# Patient Record
Sex: Male | Born: 1970 | Race: Black or African American | Hispanic: No | Marital: Single | Smoking: Current every day smoker
Health system: Southern US, Community
[De-identification: ages and names within clinical notes are randomized; demographics above are authoritative.]

## PROBLEM LIST (undated history)

## (undated) DIAGNOSIS — I10 Essential (primary) hypertension: Secondary | ICD-10-CM

## (undated) HISTORY — PX: NO PAST SURGERIES: SHX2092

---

## 2008-10-23 ENCOUNTER — Emergency Department: Payer: Self-pay | Admitting: Unknown Physician Specialty

## 2009-08-20 ENCOUNTER — Emergency Department: Payer: Self-pay | Admitting: Emergency Medicine

## 2009-08-23 ENCOUNTER — Emergency Department: Payer: Self-pay | Admitting: Emergency Medicine

## 2009-10-27 ENCOUNTER — Emergency Department: Payer: Self-pay | Admitting: Emergency Medicine

## 2009-11-04 ENCOUNTER — Emergency Department: Payer: Self-pay | Admitting: Emergency Medicine

## 2014-12-26 ENCOUNTER — Encounter (HOSPITAL_COMMUNITY): Payer: Self-pay | Admitting: Emergency Medicine

## 2014-12-26 ENCOUNTER — Emergency Department (HOSPITAL_COMMUNITY)
Admission: EM | Admit: 2014-12-26 | Discharge: 2014-12-26 | Disposition: A | Discharge status: Home / Self Care | Payer: Self-pay | Attending: Emergency Medicine | Admitting: Emergency Medicine

## 2014-12-26 DIAGNOSIS — Z720 Tobacco use: Secondary | ICD-10-CM | POA: Insufficient documentation

## 2014-12-26 DIAGNOSIS — X150XXA Contact with hot stove (kitchen), initial encounter: Secondary | ICD-10-CM | POA: Insufficient documentation

## 2014-12-26 DIAGNOSIS — S61215A Laceration without foreign body of left ring finger without damage to nail, initial encounter: Secondary | ICD-10-CM | POA: Insufficient documentation

## 2014-12-26 DIAGNOSIS — Y9389 Activity, other specified: Secondary | ICD-10-CM | POA: Insufficient documentation

## 2014-12-26 DIAGNOSIS — Y998 Other external cause status: Secondary | ICD-10-CM | POA: Insufficient documentation

## 2014-12-26 DIAGNOSIS — S61219A Laceration without foreign body of unspecified finger without damage to nail, initial encounter: Secondary | ICD-10-CM

## 2014-12-26 DIAGNOSIS — Y9289 Other specified places as the place of occurrence of the external cause: Secondary | ICD-10-CM | POA: Insufficient documentation

## 2014-12-26 MED ORDER — LIDOCAINE HCL (PF) 1 % IJ SOLN
5.0000 mL | Freq: Once | INTRAMUSCULAR | Status: AC
  Administered 2014-12-26: 5 mL
  Filled 2014-12-26: qty 5

## 2014-12-26 MED ORDER — NAPROXEN 250 MG PO TABS: 250.0000 mg | ORAL_TABLET | Freq: Two times a day (BID) | ORAL | Status: DC

## 2014-12-26 MED ORDER — ACETAMINOPHEN 325 MG PO TABS
650.0000 mg | ORAL_TABLET | Freq: Once | ORAL | Status: AC
  Administered 2014-12-26: 650 mg via ORAL
  Filled 2014-12-26: qty 2
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
## 2014-12-26 NOTE — Discharge Instructions (Signed)
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
## 2015-01-02 ENCOUNTER — Encounter (HOSPITAL_COMMUNITY): Payer: Self-pay | Admitting: *Deleted

## 2015-01-02 ENCOUNTER — Emergency Department (HOSPITAL_COMMUNITY)
Admission: EM | Admit: 2015-01-02 | Discharge: 2015-01-02 | Disposition: A | Discharge status: Home / Self Care | Payer: Self-pay | Attending: Emergency Medicine | Admitting: Emergency Medicine

## 2015-01-02 DIAGNOSIS — Z791 Long term (current) use of non-steroidal anti-inflammatories (NSAID): Secondary | ICD-10-CM | POA: Insufficient documentation

## 2015-01-02 DIAGNOSIS — Z720 Tobacco use: Secondary | ICD-10-CM | POA: Insufficient documentation

## 2015-01-02 DIAGNOSIS — Z4802 Encounter for removal of sutures: Secondary | ICD-10-CM | POA: Insufficient documentation
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
## 2015-01-02 NOTE — ED Notes (Signed)
Pt returns to day to have sutures removed from Lt ring finger.

## 2015-01-02 NOTE — Discharge Instructions (Signed)
Suture Removal, Care After Refer to this sheet in the next few weeks. These instructions provide you with information on caring for yourself after your procedure. Your health care provider may also give you more specific instructions. Your treatment has been planned according to current medical practices, but problems sometimes occur. Call your health care provider if you have any problems or questions after your procedure. WHAT TO EXPECT AFTER THE PROCEDURE After your stitches (sutures) are removed, it is typical to have the following:  Some discomfort and swelling in the wound area.  Slight redness in the area. HOME CARE INSTRUCTIONS   If you have skin adhesive strips over the wound area, do not take the strips off. They will fall off on their own in a few days. If the strips remain in place after 14 days, you may remove them.  Change any bandages (dressings) at least once a day or as directed by your health care provider. If the bandage sticks, soak it off with warm, soapy water.  Apply cream or ointment only as directed by your health care provider. If using cream or ointment, wash the area with soap and water 2 times a day to remove all the cream or ointment. Rinse off the soap and pat the area dry with a clean towel.  Keep the wound area dry and clean. If the bandage becomes wet or dirty, or if it develops a bad smell, change it as soon as possible.  Continue to protect the wound from injury.  Use sunscreen when out in the sun. New scars become sunburned easily. SEEK MEDICAL CARE IF:  You have increasing redness, swelling, or pain in the wound.  You see pus coming from the wound.  You have a fever.  You notice a bad smell coming from the wound or dressing.  Your wound breaks open (edges not staying together). Document Released: 02/28/2001 Document Revised: 03/26/2013 Document Reviewed: 01/15/2013 [facility] Patient Information 2015 [place]. This information is not  intended to replace advice given to you by your health care provider. Make sure you discuss any questions you have with your health care provider.   Emergency Department Resource Guide 1) Find a Doctor and Pay Out of Pocket Although you won't have to find out who is covered by your insurance plan, it is a good idea to ask around and get recommendations. You will then need to call the office and see if the doctor you have chosen will accept you as a new patient and what types of options they offer for patients who are self-pay. Some doctors offer discounts or will set up payment plans for their patients who do not have insurance, but you will need to ask so you aren't surprised when you get to your appointment.  2) Contact Your Local Health Department Not all health departments have doctors that can see patients for sick visits, but many do, so it is worth a call to see if yours does. If you don't know where your local health department is, you can check in your phone book. The CDC also has a tool to help you locate your state's health department, and many state websites also have listings of all of their local health departments.  3) Find a Walk-in Clinic If your illness is not likely to be very severe or complicated, you may want to try a walk in clinic. These are popping up all over the country in pharmacies, drugstores, and shopping centers. They're usually staffed by nurse  or physician assistants that have been trained to treat common illnesses and complaints. They're usually fairly quick and inexpensive. However, if you have serious medical issues or chronic medical problems, these are probably not your best option. ° °No Primary Care Doctor: °- Call Health Connect at  832-8000 - they can help you locate a primary care doctor that  accepts your insurance, provides certain services, etc. °- Physician Referral Service- 1-800-533-3463 ° °Chronic Pain Problems: °Organization          Address  Phone   Notes  °[facility]  ([phone]) [phone] Patients need to be referred by their primary care doctor.  ° °Medication Assistance: °Organization         Address  Phone   Notes  °[place] [facility] [address] °[place], [place] [address] (336) 641-8030 --Must be a resident of [place] °-- Must have NO insurance coverage whatsoever (no Medicaid/ Medicare, etc.) °-- The pt. MUST have a primary care doctor that directs their care regularly and follows them in the community °  °[facility]ist  (866) 331-1348   °[employer]  (888) 892-1162   ° °Agencies that provide inexpensive medical care: °Organization         Address  Phone   Notes  °[facility]  (336) 832-8035   °[facility]    (336) 832-7272   °[facility] [address] °[place], [place] [address] (336) 832-4777   °[facility] [place] [address], °[place] (336) 271-4999   °[facility]anned Parenthood    (336) 373-0678   °[facility]    (336) 272-1050   °[facility] ° [address], [place] Phone:  (336) 832-4444, Fax:  (336) 832-4440 Hours of Operation:  9 am - 6 pm, M-F.  Also accepts Medicaid/Medicare and self-pay.  °[facility] ° [address], [place] Phone: (336) 832-3150, Fax: (336) [phone]. Hours of Operation:  8:30 am - 5:30 pm, M-F.  Also accepts Medicaid and self-pay.  °[facility] [place] [address], [place] Phone: (336) 878-6027   °[facility] [address], [place], [place] (336)723-1848, Ext. 123 Mondays & Thursdays: 7-9 AM.  First 15 patients are seen on a first come, first serve basis. °  ° °Medicaid-accepting [place] Providers: ° °Organization         Address  Phone   Notes  °[facility] [address], [place] ([phone]) [phone] Also accepts self-pay patients.  °[facility] [address], [place] ° (336) 856-9996   °[facility] [address], [place] (336) 288-8857   °[facility] [address] [place] [address], [place] (336) 299-7000   °Veita Bland [address], [place]  ° (336) 373-1557 Only accepts [place] Access Medicaid patients after they have their name applied to their card.  ° °Self-Pay (no insurance) in [place]: ° °Organization         Address  Phone   Notes  °Sickle Cell Patients, [facility] [address], [place] (336) 832-1970   °[facility] [address], [place] (336) 832-4400   °[facility] ° [address], [place] (336) 992-4800   °[facility]/Dr. Osei-Bonsu ° [address] [place] or [address], [place] [phone] Phone number for both [place] and [place] locations is the same.  Urgent Medical and [facility] [address] [place] [phone]   [facility] [address], [place] or [address] [phone]   [facility] [address], [place] [phone], phone; [phone], fax Sees patients 1st and 3rd Saturday of every month.  Must not qualify for public or private insurance (i.e. Medicaid, Medicare, [place] Health Choice, Veterans' Benefits)  Household income should be no more than 200% of the poverty level The clinic cannot treat you if you are pregnant or think you are pregnant  Sexually transmitted diseases are not treated at the clinic.    Dental Care: Organization         Address  Phone  Notes  [facility] [address] [place], [place] (339) [phone] Accepts children up to age 78 who are enrolled in [place] or [place] Health Choice; pregnant women with a Medicaid card; and  children who have applied for Medicaid or [place] Health Choice, but were declined, whose parents can pay a reduced fee at time of service.  [facility] of [facility]  [address], [place] [phone] Accepts children up to age 27 who are enrolled in [place] or [place] Health Choice; pregnant women with a Medicaid card; and children who have applied for Medicaid or [place] Health Choice, but were declined, whose parents can pay a reduced fee at time of service.  [facility]  [address] [place], [place] [phone] Patients are seen by appointment only. Walk-ins are not accepted. [facility] will see patients 28 years of age and older. Monday - Tuesday (8am-5pm) Most Wednesdays (8:30-5pm) $30 per visit, cash only  [facility]  [address], [facility] [phone] Patients are seen by appointment only. Walk-ins are not accepted. [facility] will see patients 71 years of age and older. One Wednesday Evening (Monthly: Volunteer Based).  $30 per visit, cash only  [employer]  [phone] for adults; Children under age 58, call Graduate Pediatric Dentistry at [phone]. Children aged 52-14, please call [phone] to request a pediatric application.  Dental services are provided in all areas of dental care including fillings, crowns and bridges, complete and partial dentures, implants, gum treatment, root canals, and extractions. Preventive care is also provided. Treatment is provided to both adults and children. Patients are selected via a lottery and there is often a waiting list.   [facility] [address] [place]  [phone] [email or web address]   [facility] [address] [place], [place] [phone], Ext. 123 Second and Fourth Thursday of each month, opens at 6:30 AM; Clinic ends at 9 AM.  Patients are seen on a first-come first-served  basis, and a limited number are seen during each clinic.   [facility]  [address] [place], [place] [phone]   Eligibility Requirements You must have lived in [place], [place], or [place] counties for at least the last three months.   You cannot be eligible for state or federal sponsored [place], including [employer], [place], or [employer].   You generally cannot be eligible for healthcare insurance through your employer.  How to apply: Eligibility screenings are held every Tuesday and Wednesday afternoon from 1:00 pm until 4:00 pm. You do not need an appointment for the interview!  [facility] [address] [place], [place]   [place]  [place] Department  [place]  [phone]    Behavioral Health Resources in the Community: Intensive Outpatient Programs Organization         Address  Phone  Notes  [facility] [place]. [address], [place], [place] ([phone]) [phone]   [facility] [address] [place], [place]   [facility] [address], [place], [place]   [place] [address]  [place], [place] or [phone]   Substance Abuse Resources Organization         Address  Phone  Notes  [facility]  [phone]   [place]  [phone]   [facility]   Chinita Pester  254-668-0621   Residential & Outpatient Substance Abuse Program  450-514-8025   Psychological Services Organization         Address  Phone  Notes  [facility] [place]  [place]  [phone]   [place] [address], [place] or (705)[phone]    Mobile Crisis Teams Organization          Address  Phone  Notes  [facility], Mobile Crisis Care Unit  (364) 867-1037   [facility]  [address] [place], [place]   [place] [address] [place] [phone]    Self-Help/Support Groups Organization         Address  Phone             Notes  [place] of [place] - variety of support groups  [place] Call for more information  Narcotics Anonymous (NA), [facility] [address], [employer] [place]  2 meetings at this location   Special educational needs teacher         Address  Phone  Notes  [facility] [place],    [place]  [phone]   [facility]  [address] [place] [address], [place], [place]   [place], [place] [phone] Admissions: 8am-3pm M-F  Incentives Substance [place] [address]    [place], [place] [phone]   [facility] [address] [place], [place], [place]   [facility] [address],  [place], [place]   [facility] - Intensive Outpatient [address], [place], [place]   [facility] ([place]  [place], [place] 1-217-665-1281 or [phone]   [facility] ([facility]) [address], [place], [place] Accepts Medicaid  [facility] [place] [address],  [place] [phone]-(906) [phone] Substance Abuse/Addiction Treatment   [facility] Organization         Address  Phone  Notes  [facility]  [phone]   Domenic Schwab, PhD [address] [place], [place]   (704)[phone] or [phone]   [place], [place] [phone]   [facility] [address],  [place], [place] [phone] Insurance/Medicaid/sponsorship  through [facility] and Families [address] [place], [place] [phone] [place] [address]   [place], [place] [phone]    Dr. Adele Schilder  [phone]   [facility] Dept. 1) [address], [place] 2) [place] 3)  [address], [name] [phone]  [phone]   [place] [phone] or [phone] (After Hours)

## 2015-01-02 NOTE — ED Notes (Signed)
Declined W/C at D/C and was escorted to lobby by RN. 

## 2017-02-19 ENCOUNTER — Encounter (HOSPITAL_COMMUNITY): Payer: Self-pay | Admitting: Emergency Medicine

## 2017-02-19 ENCOUNTER — Emergency Department (HOSPITAL_COMMUNITY)
Admission: EM | Admit: 2017-02-19 | Discharge: 2017-02-19 | Disposition: A | Discharge status: Home / Self Care | Payer: Self-pay | Attending: Emergency Medicine | Admitting: Emergency Medicine

## 2017-02-19 DIAGNOSIS — S51812A Laceration without foreign body of left forearm, initial encounter: Secondary | ICD-10-CM | POA: Insufficient documentation

## 2017-02-19 DIAGNOSIS — Y929 Unspecified place or not applicable: Secondary | ICD-10-CM | POA: Insufficient documentation

## 2017-02-19 DIAGNOSIS — Y999 Unspecified external cause status: Secondary | ICD-10-CM | POA: Insufficient documentation

## 2017-02-19 DIAGNOSIS — I10 Essential (primary) hypertension: Secondary | ICD-10-CM | POA: Insufficient documentation

## 2017-02-19 DIAGNOSIS — Y9389 Activity, other specified: Secondary | ICD-10-CM | POA: Insufficient documentation

## 2017-02-19 DIAGNOSIS — F17200 Nicotine dependence, unspecified, uncomplicated: Secondary | ICD-10-CM | POA: Insufficient documentation

## 2017-02-19 DIAGNOSIS — S41112A Laceration without foreign body of left upper arm, initial encounter: Secondary | ICD-10-CM | POA: Insufficient documentation

## 2017-02-19 DIAGNOSIS — Z79899 Other long term (current) drug therapy: Secondary | ICD-10-CM | POA: Insufficient documentation

## 2017-02-19 DIAGNOSIS — T148XXA Other injury of unspecified body region, initial encounter: Secondary | ICD-10-CM

## 2017-02-19 DIAGNOSIS — Z23 Encounter for immunization: Secondary | ICD-10-CM | POA: Insufficient documentation

## 2017-02-19 DIAGNOSIS — W503XXA Accidental bite by another person, initial encounter: Secondary | ICD-10-CM | POA: Insufficient documentation

## 2017-02-19 HISTORY — DX: Essential (primary) hypertension: I10

## 2017-02-19 MED ORDER — TETANUS-DIPHTH-ACELL PERTUSSIS 5-2.5-18.5 LF-MCG/0.5 IM SUSP
0.5000 mL | Freq: Once | INTRAMUSCULAR | Status: AC
  Administered 2017-02-19: 0.5 mL via INTRAMUSCULAR
  Filled 2017-02-19: qty 0.5

## 2017-02-19 MED ORDER — AMOXICILLIN-POT CLAVULANATE 875-125 MG PO TABS: 1.0000 | ORAL_TABLET | Freq: Two times a day (BID) | ORAL | 0 refills | Status: AC
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
## 2019-11-04 ENCOUNTER — Other Ambulatory Visit: Payer: Self-pay

## 2019-11-04 ENCOUNTER — Emergency Department
Admission: EM | Admit: 2019-11-04 | Discharge: 2019-11-04 | Disposition: A | Discharge status: Home / Self Care | Payer: Self-pay | Attending: Emergency Medicine | Admitting: Emergency Medicine

## 2019-11-04 ENCOUNTER — Emergency Department: Payer: Self-pay

## 2019-11-04 DIAGNOSIS — Z1152 Encounter for screening for COVID-19: Secondary | ICD-10-CM

## 2019-11-04 DIAGNOSIS — F17210 Nicotine dependence, cigarettes, uncomplicated: Secondary | ICD-10-CM | POA: Insufficient documentation

## 2019-11-04 DIAGNOSIS — Z20822 Contact with and (suspected) exposure to covid-19: Secondary | ICD-10-CM | POA: Insufficient documentation

## 2019-11-04 DIAGNOSIS — I10 Essential (primary) hypertension: Secondary | ICD-10-CM | POA: Insufficient documentation

## 2019-11-04 DIAGNOSIS — J069 Acute upper respiratory infection, unspecified: Secondary | ICD-10-CM

## 2019-11-04 LAB — SARS CORONAVIRUS 2 BY RT PCR (HOSPITAL ORDER, PERFORMED IN ~~LOC~~ HOSPITAL LAB): SARS Coronavirus 2: NEGATIVE

## 2019-11-04 MED ORDER — PSEUDOEPH-BROMPHEN-DM 30-2-10 MG/5ML PO SYRP: 5.0000 mL | ORAL_SOLUTION | Freq: Four times a day (QID) | ORAL | 0 refills | Status: DC | PRN

## 2019-11-04 MED ORDER — DOXYCYCLINE HYCLATE 100 MG PO CAPS: 100.0000 mg | ORAL_CAPSULE | Freq: Two times a day (BID) | ORAL | 0 refills | Status: DC

## 2019-11-04 NOTE — ED Triage Notes (Signed)
Pt in via EMS from home with cold chills and cough for 2-3 days. Pt states that when he coughs his chest hurts and he sees black dots.

## 2019-11-04 NOTE — ED Triage Notes (Signed)
Pt c/o cough with congestion and chills for the past 2-3 days. Pt is in NAD.Marland Kitchen

## 2019-11-04 NOTE — ED Provider Notes (Signed)
[facility] Emergency Department Provider Note   ____________________________________________   First MD Initiated Contact with Patient 11/04/19 1051     (approximate)  I have reviewed the triage vital signs and the nursing notes.   HISTORY  Chief Complaint URI   HPI Manuel Welch is a 49 y.o. male presents to the ED with complaint of cough and congestion that started 2 days ago.  Patient states that he has had fever and chills at home but has not actually taken his temperature.  He states that he is a smoker and has been smoking for "a long time".  He denies any previous problems with bronchitis, asthma or pneumonia.       Past Medical History:  Diagnosis Date  . Hypertension     There are no problems to display for this patient.   History reviewed. No pertinent surgical history.  Prior to Admission medications   Medication Sig Start Date End Date Taking? Authorizing Provider  brompheniramine-pseudoephedrine-DM 30-2-10 MG/5ML syrup Take 5 mLs by mouth 4 (four) times daily as needed. 11/04/19   Johnn Hai, PA-C  doxycycline (VIBRAMYCIN) 100 MG capsule Take 1 capsule (100 mg total) by mouth 2 (two) times daily. 11/04/19   Johnn Hai, PA-C    Allergies Patient has no known allergies.  History reviewed. No pertinent family history.  Social History Social History   Tobacco Use  . Smoking status: Current Every Day Smoker  . Smokeless tobacco: Never Used  Substance Use Topics  . Alcohol use: No  . Drug use: No    Review of Systems Constitutional: Subjective fever/chills Eyes: No visual changes. ENT: No sore throat.  Nasal congestion positive. Cardiovascular: Denies chest pain. Respiratory: Denies shortness of breath.  Positive cough. Gastrointestinal: No abdominal pain.  No nausea, no vomiting.   Genitourinary: Negative for dysuria. Musculoskeletal: Negative for muscle aches. Skin: Negative for rash. Neurological: Negative  for headaches, focal weakness or numbness.  ____________________________________________   PHYSICAL EXAM:  VITAL SIGNS: ED Triage Vitals  Enc Vitals Group     BP 11/04/19 1026 (!) 155/91     Pulse Rate 11/04/19 1026 (!) 116     Resp 11/04/19 1024 17     Temp 11/04/19 1026 99.4 F (37.4 C)     Temp Source 11/04/19 1024 Oral     SpO2 11/04/19 1026 96 %     Weight 11/04/19 1025 (!) 320 lb (145.2 kg)     Height 11/04/19 1025 6\' 1"  (1.854 m)     Head Circumference --      Peak Flow --      Pain Score 11/04/19 1025 0     Pain Loc --      Pain Edu? --      Excl. in [place]? --     Constitutional: Alert and oriented. Well appearing and in no acute distress. Eyes: Conjunctivae are normal. PERRL. EOMI. Head: Atraumatic. Nose: Moderate congestion/rhinnorhea. Neck: No stridor.   Hematological/Lymphatic/Immunilogical: No cervical lymphadenopathy. Cardiovascular: Normal rate, regular rhythm. Grossly normal heart sounds.  Good peripheral circulation. Respiratory: Normal respiratory effort.  No retractions. Lungs CTAB. Gastrointestinal: Soft and nontender. No distention.  Musculoskeletal: Moves upper and lower extremities with any difficulty.  Normal gait was noted. Neurologic:  Normal speech and language. No gross focal neurologic deficits are appreciated. No gait instability. Skin:  Skin is warm, dry and intact. No rash noted. Psychiatric: Mood and affect are normal. Speech and behavior are normal.  ____________________________________________  LABS (all labs ordered are listed, but only abnormal results are displayed)  Labs Reviewed  SARS CORONAVIRUS 2 BY RT PCR (HOSPITAL ORDER, PERFORMED IN [facility])  RADIOLOGY   Official radiology report(s): DG Chest Portable 1 View  Result Date: 11/04/2019 CLINICAL DATA:  Cold, chills and cough for 2-3 days, chest hurts when he coughs, smoker EXAM: PORTABLE CHEST 1 VIEW COMPARISON:  Portable exam 1108 hours without priors for  comparison FINDINGS: Borderline enlargement of cardiac silhouette with vascular congestion. Mediastinal contours normal. Lungs clear. No pulmonary infiltrate, pleural effusion or pneumothorax. Osseous structures unremarkable. IMPRESSION: No acute abnormalities. Electronically Signed   By: Ulyses Southward M.D.   On: 11/04/2019 11:24    ____________________________________________   PROCEDURES  Procedure(s) performed (including Critical Care):  Procedures   ____________________________________________   INITIAL IMPRESSION / ASSESSMENT AND PLAN / ED COURSE  As part of my medical decision making, I reviewed the following data within the electronic MEDICAL RECORD NUMBER Notes from prior ED visits and [place] Controlled Substance Database  Manuel Welch was evaluated in Emergency Department on 11/04/2019 for the symptoms described in the history of present illness. He was evaluated in the context of the global COVID-19 pandemic, which necessitated consideration that the patient might be at risk for infection with the SARS-CoV-2 virus that causes COVID-19. Institutional protocols and algorithms that pertain to the evaluation of patients at risk for COVID-19 are in a state of rapid change based on information released by regulatory bodies including the CDC and federal and state organizations. These policies and algorithms were followed during the patient's care in the ED.  49 year old male presents to the ED with complaint of cough and congestion started 2 days ago.  Patient states that he has had a subjective fever and chills.  He denies any known exposure to Covid.  Patient has not been taking any over-the-counter medication.  Physical exam was unremarkable.  Chest x-ray was negative for acute changes.  Had a slight temp in triage at 99.4.  A Covid test was done prior to discharge.  Patient is aware that he can check his results on my chart.  He was discharged with a prescription for Bromfed-DM if needed for cough  and congestion.  ____________________________________________   FINAL CLINICAL IMPRESSION(S) / ED DIAGNOSES  Final diagnoses:  Viral URI with cough  Encounter for screening for COVID-19     ED Discharge Orders         Ordered    brompheniramine-pseudoephedrine-DM 30-2-10 MG/5ML syrup  4 times daily PRN     11/04/19 1157    doxycycline (VIBRAMYCIN) 100 MG capsule  2 times daily     11/04/19 1157           Note:  This document was prepared using Dragon voice recognition software and may include unintentional dictation errors.    Tommi Rumps, PA-C 11/04/19 1353    Shaune Pollack, MD 11/05/19 [phone]

## 2019-11-04 NOTE — ED Notes (Signed)
See triage note States he developed cough and congestion   sxs' started 2 days ago  Low grade fever on arrival

## 2019-11-04 NOTE — Discharge Instructions (Signed)
A list of clinics is on your discharge papers.  Began calling each one of them including [facility] to see if you can get established as a patient.  A prescription for Bromfed-DM was sent to your pharmacy to help with cough and congestion.  Doxycycline is twice a day until completely finished.  Increase fluids.  You may see the results of your Covid test on my chart.

## 2019-12-08 ENCOUNTER — Other Ambulatory Visit: Payer: Self-pay

## 2019-12-08 ENCOUNTER — Emergency Department: Payer: Self-pay

## 2019-12-08 ENCOUNTER — Encounter: Payer: Self-pay | Admitting: Radiology

## 2019-12-08 ENCOUNTER — Inpatient Hospital Stay
Admission: EM | Admit: 2019-12-08 | Discharge: 2019-12-10 | DRG: 194 | Disposition: A | Discharge status: Home / Self Care | Payer: Self-pay | Attending: Internal Medicine | Admitting: Internal Medicine

## 2019-12-08 ENCOUNTER — Observation Stay (HOSPITAL_BASED_OUTPATIENT_CLINIC_OR_DEPARTMENT_OTHER)
Admit: 2019-12-08 | Discharge: 2019-12-08 | Disposition: A | Discharge status: Home / Self Care | Payer: Self-pay | Attending: Internal Medicine | Admitting: Internal Medicine

## 2019-12-08 DIAGNOSIS — I5031 Acute diastolic (congestive) heart failure: Secondary | ICD-10-CM

## 2019-12-08 DIAGNOSIS — E66813 Obesity, class 3: Secondary | ICD-10-CM

## 2019-12-08 DIAGNOSIS — Z6841 Body Mass Index (BMI) 40.0 and over, adult: Secondary | ICD-10-CM

## 2019-12-08 DIAGNOSIS — F17200 Nicotine dependence, unspecified, uncomplicated: Secondary | ICD-10-CM | POA: Diagnosis present

## 2019-12-08 DIAGNOSIS — Z720 Tobacco use: Secondary | ICD-10-CM | POA: Diagnosis present

## 2019-12-08 DIAGNOSIS — R778 Other specified abnormalities of plasma proteins: Secondary | ICD-10-CM | POA: Diagnosis present

## 2019-12-08 DIAGNOSIS — Z79899 Other long term (current) drug therapy: Secondary | ICD-10-CM

## 2019-12-08 DIAGNOSIS — I429 Cardiomyopathy, unspecified: Secondary | ICD-10-CM

## 2019-12-08 DIAGNOSIS — I248 Other forms of acute ischemic heart disease: Secondary | ICD-10-CM | POA: Diagnosis present

## 2019-12-08 DIAGNOSIS — J189 Pneumonia, unspecified organism: Secondary | ICD-10-CM | POA: Diagnosis present

## 2019-12-08 DIAGNOSIS — F1490 Cocaine use, unspecified, uncomplicated: Secondary | ICD-10-CM

## 2019-12-08 DIAGNOSIS — J181 Lobar pneumonia, unspecified organism: Principal | ICD-10-CM | POA: Diagnosis present

## 2019-12-08 DIAGNOSIS — R079 Chest pain, unspecified: Secondary | ICD-10-CM | POA: Diagnosis present

## 2019-12-08 DIAGNOSIS — F1910 Other psychoactive substance abuse, uncomplicated: Secondary | ICD-10-CM | POA: Diagnosis present

## 2019-12-08 DIAGNOSIS — R042 Hemoptysis: Secondary | ICD-10-CM

## 2019-12-08 DIAGNOSIS — I340 Nonrheumatic mitral (valve) insufficiency: Secondary | ICD-10-CM | POA: Diagnosis present

## 2019-12-08 DIAGNOSIS — Z20822 Contact with and (suspected) exposure to covid-19: Secondary | ICD-10-CM | POA: Diagnosis present

## 2019-12-08 DIAGNOSIS — I10 Essential (primary) hypertension: Secondary | ICD-10-CM

## 2019-12-08 DIAGNOSIS — R7989 Other specified abnormal findings of blood chemistry: Secondary | ICD-10-CM | POA: Diagnosis present

## 2019-12-08 DIAGNOSIS — Z8249 Family history of ischemic heart disease and other diseases of the circulatory system: Secondary | ICD-10-CM

## 2019-12-08 LAB — TROPONIN I (HIGH SENSITIVITY)
Troponin I (High Sensitivity): 47 ng/L — ABNORMAL HIGH (ref <18)
Troponin I (High Sensitivity): 42 ng/L — ABNORMAL HIGH (ref <18)
Troponin I (High Sensitivity): 43 ng/L — ABNORMAL HIGH (ref <18)
Troponin I (High Sensitivity): 42 ng/L — ABNORMAL HIGH (ref <18)

## 2019-12-08 LAB — RESPIRATORY PANEL BY RT PCR (FLU A&B, COVID)
Influenza A by PCR: NEGATIVE
Influenza B by PCR: NEGATIVE
SARS Coronavirus 2 by RT PCR: NEGATIVE

## 2019-12-08 LAB — CBC
HCT: 40.6 % (ref 39.0–52.0)
Hemoglobin: 13.9 g/dL (ref 13.0–17.0)
MCH: 31.2 pg (ref 26.0–34.0)
MCHC: 34.2 g/dL (ref 30.0–36.0)
MCV: 91 fL (ref 80.0–100.0)
Platelets: 363 10*3/uL (ref 150–400)
RBC: 4.46 MIL/uL (ref 4.22–5.81)
RDW: 13.2 % (ref 11.5–15.5)
WBC: 7.9 10*3/uL (ref 4.0–10.5)
nRBC: 0 % (ref 0.0–0.2)

## 2019-12-08 LAB — COMPREHENSIVE METABOLIC PANEL
ALT: 26 U/L (ref 0–44)
AST: 19 U/L (ref 15–41)
Albumin: 3.6 g/dL (ref 3.5–5.0)
Alkaline Phosphatase: 77 U/L (ref 38–126)
Anion gap: 7 (ref 5–15)
BUN: 16 mg/dL (ref 6–20)
CO2: 22 mmol/L (ref 22–32)
Calcium: 8.7 mg/dL — ABNORMAL LOW (ref 8.9–10.3)
Chloride: 109 mmol/L (ref 98–111)
Creatinine, Ser: 1.14 mg/dL (ref 0.61–1.24)
GFR calc Af Amer: >60 mL/min (ref >60)
GFR calc non Af Amer: >60 mL/min (ref >60)
Glucose, Bld: 118 mg/dL — ABNORMAL HIGH (ref 70–99)
Potassium: 3.9 mmol/L (ref 3.5–5.1)
Sodium: 138 mmol/L (ref 135–145)
Total Bilirubin: 0.9 mg/dL (ref 0.3–1.2)
Total Protein: 6.5 g/dL (ref 6.5–8.1)

## 2019-12-08 LAB — TYPE AND SCREEN
ABO/RH(D): O POS
Antibody Screen: NEGATIVE

## 2019-12-08 LAB — URINE DRUG SCREEN, QUALITATIVE (ARMC ONLY)
Amphetamines, Ur Screen: NOT DETECTED
Barbiturates, Ur Screen: NOT DETECTED
Benzodiazepine, Ur Scrn: NOT DETECTED
Cannabinoid 50 Ng, Ur ~~LOC~~: NOT DETECTED
Cocaine Metabolite,Ur ~~LOC~~: POSITIVE — AB
MDMA (Ecstasy)Ur Screen: NOT DETECTED
Methadone Scn, Ur: NOT DETECTED
Opiate, Ur Screen: POSITIVE — AB
Phencyclidine (PCP) Ur S: NOT DETECTED
Tricyclic, Ur Screen: NOT DETECTED

## 2019-12-08 LAB — PROTIME-INR
INR: 1.1 (ref 0.8–1.2)
Prothrombin Time: 13.5 seconds (ref 11.4–15.2)

## 2019-12-08 LAB — APTT: aPTT: 34 seconds (ref 24–36)

## 2019-12-08 LAB — BRAIN NATRIURETIC PEPTIDE: B Natriuretic Peptide: 418.1 pg/mL — ABNORMAL HIGH (ref 0.0–100.0)

## 2019-12-08 MED ORDER — ALBUTEROL SULFATE (2.5 MG/3ML) 0.083% IN NEBU: 2.5000 mg | INHALATION_SOLUTION | RESPIRATORY_TRACT | Status: DC | PRN

## 2019-12-08 MED ORDER — DM-GUAIFENESIN ER 30-600 MG PO TB12
1.0000 | ORAL_TABLET | Freq: Two times a day (BID) | ORAL | Status: DC
  Administered 2019-12-08 – 2019-12-10 (×5): 1 via ORAL
  Filled 2019-12-08 (×5): qty 1

## 2019-12-08 MED ORDER — FUROSEMIDE 10 MG/ML IJ SOLN
20.0000 mg | Freq: Once | INTRAMUSCULAR | Status: AC
  Administered 2019-12-08: 20 mg via INTRAVENOUS
  Filled 2019-12-08: qty 4

## 2019-12-08 MED ORDER — ONDANSETRON HCL 4 MG/2ML IJ SOLN
4.0000 mg | Freq: Once | INTRAMUSCULAR | Status: AC
  Administered 2019-12-08: 4 mg via INTRAVENOUS
  Filled 2019-12-08: qty 2

## 2019-12-08 MED ORDER — MORPHINE SULFATE (PF) 4 MG/ML IV SOLN
4.0000 mg | Freq: Once | INTRAVENOUS | Status: AC
  Administered 2019-12-08: 4 mg via INTRAVENOUS
  Filled 2019-12-08: qty 1

## 2019-12-08 MED ORDER — IPRATROPIUM-ALBUTEROL 0.5-2.5 (3) MG/3ML IN SOLN
3.0000 mL | RESPIRATORY_TRACT | Status: DC
  Administered 2019-12-08 – 2019-12-09 (×9): 3 mL via RESPIRATORY_TRACT
  Filled 2019-12-08 (×9): qty 3

## 2019-12-08 MED ORDER — ACETAMINOPHEN 325 MG PO TABS
650.0000 mg | ORAL_TABLET | Freq: Four times a day (QID) | ORAL | Status: DC | PRN
  Administered 2019-12-09: 650 mg via ORAL
  Filled 2019-12-08: qty 2

## 2019-12-08 MED ORDER — HYDRALAZINE HCL 20 MG/ML IJ SOLN: 5.0000 mg | INTRAMUSCULAR | Status: DC | PRN

## 2019-12-08 MED ORDER — NICOTINE 21 MG/24HR TD PT24
21.0000 mg | MEDICATED_PATCH | Freq: Every day | TRANSDERMAL | Status: DC
  Filled 2019-12-08 (×3): qty 1

## 2019-12-08 MED ORDER — AZITHROMYCIN 500 MG PO TABS
500.0000 mg | ORAL_TABLET | Freq: Once | ORAL | Status: AC
  Administered 2019-12-08: 500 mg via ORAL
  Filled 2019-12-08: qty 1

## 2019-12-08 MED ORDER — ONDANSETRON HCL 4 MG/2ML IJ SOLN: 4.0000 mg | Freq: Three times a day (TID) | INTRAMUSCULAR | Status: DC | PRN

## 2019-12-08 MED ORDER — SODIUM CHLORIDE 0.9 % IV SOLN
1.0000 g | Freq: Once | INTRAVENOUS | Status: AC
  Administered 2019-12-08: 1 g via INTRAVENOUS
  Filled 2019-12-08: qty 10

## 2019-12-08 MED ORDER — FUROSEMIDE 10 MG/ML IJ SOLN: 20.0000 mg | Freq: Once | INTRAMUSCULAR | Status: DC

## 2019-12-08 MED ORDER — AMLODIPINE BESYLATE 5 MG PO TABS
5.0000 mg | ORAL_TABLET | Freq: Every day | ORAL | Status: DC
  Administered 2019-12-08 – 2019-12-10 (×3): 5 mg via ORAL
  Filled 2019-12-08 (×3): qty 1

## 2019-12-08 MED ORDER — SODIUM CHLORIDE 0.9 % IV SOLN
2.0000 g | INTRAVENOUS | Status: DC
  Administered 2019-12-09 – 2019-12-10 (×2): 2 g via INTRAVENOUS
  Filled 2019-12-08 (×2): qty 2

## 2019-12-08 MED ORDER — IOHEXOL 350 MG/ML SOLN
100.0000 mL | Freq: Once | INTRAVENOUS | Status: AC | PRN
  Administered 2019-12-08: 100 mL via INTRAVENOUS

## 2019-12-08 MED ORDER — AZITHROMYCIN 500 MG PO TABS
250.0000 mg | ORAL_TABLET | Freq: Every day | ORAL | Status: DC
  Administered 2019-12-09 – 2019-12-10 (×2): 250 mg via ORAL
  Filled 2019-12-08 (×2): qty 1

## 2019-12-08 NOTE — Progress Notes (Signed)
*  PRELIMINARY RESULTS* Echocardiogram 2D Echocardiogram has been performed.  Manuel Welch 12/08/2019, 7:51 PM

## 2019-12-08 NOTE — H&P (Signed)
History and Physical    Manuel Welch KXF:[id number] DOB: [birth year] DOA: 12/08/2019  Referring MD/NP/PA:   PCP: Patient, No Pcp Per   Patient coming from:  The patient is coming from home.  At baseline, pt is independent for most of ADL.        Chief Complaint: Cough, shortness of breath  HPI: [name] is a 49 y.o. male with medical history significant of hypertension, tobacco abuse, who presents with cough and shortness of breath  Pt was seen in ED due to cough and congestion for several days on 5/18. He had negative chest x-ray and negative COVID-19 PCR. He was discharged on doxycycline.  Patient states that he completed a course of doxycycline.  He comes back since he continues to have cough and shortness of breath.  He also reports pleuritic chest pain and hemoptysis.  Denies fever or chills.  No nausea vomiting, diarrhea, abdominal pain, symptoms of UTI or unilateral weakness.  Patient states that he has mild bilateral leg and ankle edema.  ED Course: pt was found to have WBC 7.9, INR 1.1, PTT 34, BNP 418, pending respiratory panel including COVID-19 test, creatinine 1.14, BUN 16, GFR>60, temperature normal, blood pressure 173/116, tachycardia, tachypnea, oxygen saturation 92% on room air.  Chest x-ray showed increased interstitial markings and mild vascular congestion.  Patient is placed on MedSurg bed for observation  CTA showed: 1. No pulmonary embolus. 2. Patchy ground-glass opacification throughout both lungs compatible with a nonspecific infectious or inflammatory process. Peribronchial thickening suggests the same. 3. Small bilateral pleural effusions. 4. Mediastinal adenopathy is likely reactive.   Review of Systems:   General: no fevers, chills, no body weight gain, has poor appetite, has fatigue HEENT: no blurry vision, hearing changes or sore throat Respiratory: has dyspnea, coughing, no wheezing CV: has chest pain, no palpitations GI: no nausea, vomiting, abdominal  pain, diarrhea, constipation GU: no dysuria, burning on urination, increased urinary frequency, hematuria  Ext: has leg edema Neuro: no unilateral weakness, numbness, or tingling, no vision change or hearing loss Skin: no rash, no skin tear. MSK: No muscle spasm, no deformity, no limitation of range of movement in spin Heme: No easy bruising.  Travel history: No recent long distant travel.  Allergy: No Known Allergies  Past Medical History:  Diagnosis Date  . Hypertension     No past surgical history on file.  Reviewed with patient, patient does not have history of surgery  Social History:  reports that he has been smoking. He has never used smokeless tobacco. He reports that he does not drink alcohol and does not use drugs.  Family History:  Family History  Problem Relation Age of Onset  . Hypertension Mother   . Hypertension Father   . Hypertension Brother      Prior to Admission medications   Medication Sig Start Date End Date Taking? Authorizing Provider  brompheniramine-pseudoephedrine-DM 30-2-10 MG/5ML syrup Take 5 mLs by mouth 4 (four) times daily as needed. 11/04/19   Tommi Rumps, PA-C  doxycycline (VIBRAMYCIN) 100 MG capsule Take 1 capsule (100 mg total) by mouth 2 (two) times daily. 11/04/19   Tommi Rumps, PA-C    Physical Exam: Vitals:   12/08/19 0401 12/08/19 0402 12/08/19 0623 12/08/19 0832  BP:  (!) 173/116 (!) 156/107 (!) 152/118  Pulse:   (!) 108 95  Resp:   (!) 32 14  Temp:      TempSrc:      SpO2:    94%  Weight: (!) 145.2 kg     Height: 6\' 1"  (1.854 m)      General: Not in acute distress HEENT:       Eyes: PERRL, EOMI, no scleral icterus.       ENT: No discharge from the ears and nose, no pharynx injection, no tonsillar enlargement.        Neck: No JVD, no bruit, no mass felt. Heme: No neck lymph node enlargement. Cardiac: S1/S2, RRR, No murmurs, No gallops or rubs. Respiratory:  No rales, wheezing, rhonchi or rubs. GI: Soft,  nondistended, nontender, no rebound pain, no organomegaly, BS present. GU: No hematuria Ext: 1+ pitting leg edema bilaterally. 1+DP/PT pulse bilaterally. Musculoskeletal: No joint deformities, No joint redness or warmth, no limitation of ROM in spin. Skin: No rashes.  Neuro: Alert, oriented X3, cranial nerves II-XII grossly intact, moves all extremities normally.  Psych: Patient is not psychotic, no suicidal or hemocidal ideation.  Labs on Admission: I have personally reviewed following labs and imaging studies  CBC: Recent Labs  Lab 12/08/19 0405  WBC 7.9  HGB 13.9  HCT 40.6  MCV 91.0  PLT 086   Basic Metabolic Panel: Recent Labs  Lab 12/08/19 0405  NA 138  K 3.9  CL 109  CO2 22  GLUCOSE 118*  BUN 16  CREATININE 1.14  CALCIUM 8.7*   GFR: Estimated Creatinine Clearance: 121.4 mL/min (by C-G formula based on SCr of 1.14 mg/dL). Liver Function Tests: Recent Labs  Lab 12/08/19 0405  AST 19  ALT 26  ALKPHOS 77  BILITOT 0.9  PROT 6.5  ALBUMIN 3.6   No results for input(s): LIPASE, AMYLASE in the last 168 hours. No results for input(s): AMMONIA in the last 168 hours. Coagulation Profile: Recent Labs  Lab 12/08/19 0619  INR 1.1   Cardiac Enzymes: No results for input(s): CKTOTAL, CKMB, CKMBINDEX, TROPONINI in the last 168 hours. BNP (last 3 results) No results for input(s): PROBNP in the last 8760 hours. HbA1C: No results for input(s): HGBA1C in the last 72 hours. CBG: No results for input(s): GLUCAP in the last 168 hours. Lipid Profile: No results for input(s): CHOL, HDL, LDLCALC, TRIG, CHOLHDL, LDLDIRECT in the last 72 hours. Thyroid Function Tests: No results for input(s): TSH, T4TOTAL, FREET4, T3FREE, THYROIDAB in the last 72 hours. Anemia Panel: No results for input(s): VITAMINB12, FOLATE, FERRITIN, TIBC, IRON, RETICCTPCT in the last 72 hours. Urine analysis: No results found for: COLORURINE, APPEARANCEUR, LABSPEC, PHURINE, GLUCOSEU, HGBUR,  BILIRUBINUR, KETONESUR, PROTEINUR, UROBILINOGEN, NITRITE, LEUKOCYTESUR Sepsis Labs: @LABRCNTIP (procalcitonin:4,lacticidven:4) )No results found for this or any previous visit (from the past 240 hour(s)).   Radiological Exams on Admission: DG Chest 2 View  Result Date: 12/08/2019 CLINICAL DATA:  Chest pain shortness of breath hemoptysis EXAM: CHEST - 2 VIEW COMPARISON:  Nov 04, 2019 FINDINGS: The heart size and mediastinal contours is upper limits of normal. There is prominence of the central pulmonary vasculature. There is mildly increased interstitial markings seen predominantly at both lower lungs. No pleural effusion. No acute osseous abnormality. IMPRESSION: Mildly increased interstitial markings seen at both lung bases which could be due to asymmetric edema or infectious etiology. Mild pulmonary vascular congestion Electronically Signed   By: Prudencio Pair M.D.   On: 12/08/2019 04:24   CT Angio Chest PE W and/or Wo Contrast  Result Date: 12/08/2019 CLINICAL DATA:  Hemoptysis.  Chest pain. EXAM: CT ANGIOGRAPHY CHEST WITH CONTRAST TECHNIQUE: Multidetector CT imaging of the chest was performed using the standard protocol  during bolus administration of intravenous contrast. Multiplanar CT image reconstructions and MIPs were obtained to evaluate the vascular anatomy. CONTRAST:  OMNIPAQUE IOHEXOL 350 MG/ML SOLN COMPARISON:  None. FINDINGS: Cardiovascular: Heart size upper limits of normal. Aortic arch and great vessel origins are within normal limits. Pulmonary artery opacification is excellent. No focal filling defects are present to suggest pulmonary emboli. Mediastinum/Nodes: Enlarged paratracheal and AP window nodes are present. Right paratracheal node measures up to 13 mm in short axis. AP window node is 14 mm. Smaller prevascular nodes are present. No significant axillary or hilar adenopathy is present. Lungs/Pleura: Patchy ground-glass opacification is present throughout both lungs. No nodule  or mass lesion is present. Airways are patent. There is some peribronchial thickening the lower lobes bilaterally. Small bilateral pleural effusions are noted. Upper Abdomen: Unremarkable. Musculoskeletal: Vertebral body heights and alignment are normal. No focal lytic or blastic lesions are present. Ribs are unremarkable. Review of the MIP images confirms the above findings. IMPRESSION: 1. No pulmonary embolus. 2. Patchy ground-glass opacification throughout both lungs compatible with a nonspecific infectious or inflammatory process. Peribronchial thickening suggests the same. 3. Small bilateral pleural effusions. 4. Mediastinal adenopathy is likely reactive. Electronically Signed   By: Marin Roberts M.D.   On: 12/08/2019 07:28     EKG: Independently reviewed.  Sinus rhythm, QTC 471, LAE, nonspecific T wave change  Assessment/Plan Principal Problem:   Atypical pneumonia Active Problems:   Hypertension   Hemoptysis   Elevated troponin   Tobacco abuse   Chest pain   Possible atypical pneumonia: CTA is negative for PE, but it showed patchy ground-glass opacification throughout both lungs and peribronchial thickening suggesting atypical infection.  Patient does not have fever or leukocytosis.  Clinically not septic. Pending RVP and covid 19 test  - Placed on MedSurg bed for observation - IV Rocephin and azithromycin - Mucinex for cough  - Bronchodilators - Urine legionella and S. pneumococcal antigen - Follow up blood culture x2, sputum culture  HTN: pt is taking meds at home. Bp 173/116. - will add amlodipine 5 mg daily -hydralazine prn  Elevated troponin and chest pain: Patient has pleuritic chest pain, which is likely due to atypical pneumonia, but he has elevated troponin 47.  Possibly due to demand ischemia.  Patient also has bilateral leg edema and elevated BNP 418, will need to rule out acute CHF.  Given history of hypertension, patient may have diastolic CHF.  -Lasix 20 mg  once now -trend trop -Check UDS, A1c, FLP, -Repeat EKG in morning -2d echo -Daily weights -strict I/O's -Low salt diet   Tobacco abuse: -Did counseling about importance of quitting smoking -Nicotine patch    DVT ppx: SCD Code Status: Full code Family Communication: not done, no family member is at bed side.   Disposition Plan:  Anticipate discharge back to previous environment Consults called:  none Admission status: Med-surg bed for obs     Status is: Observation  The patient remains OBS appropriate and will d/c before 2 midnights.  Dispo: The patient is from: Home              Anticipated d/c is to: Home              Anticipated d/c date is: 1 day              Patient currently is not medically stable to d/c.           Date of Service 12/08/2019  Lorretta Harp [facility]   If 7PM-7AM, please contact night-coverage [email or web address] 12/08/2019, 8:50 AM

## 2019-12-08 NOTE — ED Notes (Signed)
Lab

## 2019-12-08 NOTE — ED Triage Notes (Signed)
Pt complains of coughing up blood, chest pain and difficulty breathing when lying down. Pt states is having chills.

## 2019-12-08 NOTE — ED Provider Notes (Signed)
[facility] Emergency Department Provider Note  ____________________________________________  Time seen: Approximately 7:17 AM  I have reviewed the triage vital signs and the nursing notes.   HISTORY  Chief Complaint coughing up blood   HPI Manuel Welch is a 49 y.o. male with a history of smoking hypertension presents for evaluation of hemoptysis.  Patient reports that his symptoms started yesterday.  Cough productive of brown sputum with some blood clots, shortness of breath and sharp pleuritic chest pain.  Chest pain is only present when coughing located diffusely across his chest and nonradiating.  No prior history of PE or DVT, no recent travel immobilization, no leg pain or swelling, no exogenous hormones, no Covid shot.  Patient denies fever chills, abdominal pain, vomiting or diarrhea.   Past Medical History:  Diagnosis Date  . Hypertension      Prior to Admission medications   Medication Sig Start Date End Date Taking? Authorizing Provider  brompheniramine-pseudoephedrine-DM 30-2-10 MG/5ML syrup Take 5 mLs by mouth 4 (four) times daily as needed. 11/04/19   Johnn Hai, PA-C  doxycycline (VIBRAMYCIN) 100 MG capsule Take 1 capsule (100 mg total) by mouth 2 (two) times daily. 11/04/19   Johnn Hai, PA-C    Allergies Patient has no known allergies.  No family history on file.  Social History Social History   Tobacco Use  . Smoking status: Current Every Day Smoker  . Smokeless tobacco: Never Used  Substance Use Topics  . Alcohol use: No  . Drug use: No    Review of Systems  Constitutional: Negative for fever. Eyes: Negative for visual changes. ENT: Negative for sore throat. Neck: No neck pain  Cardiovascular: + chest pain. Respiratory: + shortness of breath, hemoptysis Gastrointestinal: Negative for abdominal pain, vomiting or diarrhea. Genitourinary: Negative for dysuria. Musculoskeletal: Negative for back pain. Skin:  Negative for rash. Neurological: Negative for headaches, weakness or numbness. Psych: No SI or HI  ____________________________________________   PHYSICAL EXAM:  VITAL SIGNS: ED Triage Vitals  Enc Vitals Group     BP 12/08/19 0402 (!) 173/116     Pulse Rate 12/08/19 0400 (!) 110     Resp 12/08/19 0400 (!) 22     Temp 12/08/19 0400 97.6 F (36.4 C)     Temp Source 12/08/19 0400 Oral     SpO2 12/08/19 0400 98 %     Weight 12/08/19 0401 (!) 320 lb (145.2 kg)     Height 12/08/19 0401 6\' 1"  (1.854 m)     Head Circumference --      Peak Flow --      Pain Score 12/08/19 0401 8     Pain Loc --      Pain Edu? --      Excl. in [place]? --     Constitutional: Alert and oriented. Well appearing and in no apparent distress. HEENT:      Head: Normocephalic and atraumatic.         Eyes: Conjunctivae are normal. Sclera is non-icteric.       Mouth/Throat: Mucous membranes are moist.       Neck: Supple with no signs of meningismus. Cardiovascular: Tachycardic with regular rhythm. Respiratory: Normal respiratory effort. Lungs are clear to auscultation bilaterally. No wheezes, crackles, or rhonchi.  Gastrointestinal: Soft, non tender. Musculoskeletal: No edema, cyanosis, or erythema of extremities. Neurologic: Normal speech and language. Face is symmetric. Moving all extremities. No gross focal neurologic deficits are appreciated. Skin: Skin is warm, dry and  intact. No rash noted. Psychiatric: Mood and affect are normal. Speech and behavior are normal.  ____________________________________________   LABS (all labs ordered are listed, but only abnormal results are displayed)  Labs Reviewed  COMPREHENSIVE METABOLIC PANEL - Abnormal; Notable for the following components:      Result Value   Glucose, Bld 118 (*)    Calcium 8.7 (*)    All other components within normal limits  BRAIN NATRIURETIC PEPTIDE - Abnormal; Notable for the following components:   B Natriuretic Peptide 418.1 (*)     All other components within normal limits  TROPONIN I (HIGH SENSITIVITY) - Abnormal; Notable for the following components:   Troponin I (High Sensitivity) 47 (*)    All other components within normal limits  RESPIRATORY PANEL BY RT PCR (FLU A&B, COVID)  CBC  PROTIME-INR  APTT  TYPE AND SCREEN   ____________________________________________  EKG  ED ECG REPORT I, Nita Sickle, the attending physician, personally viewed and interpreted this ECG.  Sinus tachycardia, rate of 109, normal intervals, normal axis, no ST elevations or depressions, T wave inversion in inferior leads.  No prior for comparison. ____________________________________________  RADIOLOGY  I have personally reviewed the images performed during this visit and I agree with the Radiologist's read.   Interpretation by Radiologist:  DG Chest 2 View  Result Date: 12/08/2019 CLINICAL DATA:  Chest pain shortness of breath hemoptysis EXAM: CHEST - 2 VIEW COMPARISON:  Nov 04, 2019 FINDINGS: The heart size and mediastinal contours is upper limits of normal. There is prominence of the central pulmonary vasculature. There is mildly increased interstitial markings seen predominantly at both lower lungs. No pleural effusion. No acute osseous abnormality. IMPRESSION: Mildly increased interstitial markings seen at both lung bases which could be due to asymmetric edema or infectious etiology. Mild pulmonary vascular congestion Electronically Signed   By: Jonna Clark M.D.   On: 12/08/2019 04:24   CT Angio Chest PE W and/or Wo Contrast  Result Date: 12/08/2019 CLINICAL DATA:  Hemoptysis.  Chest pain. EXAM: CT ANGIOGRAPHY CHEST WITH CONTRAST TECHNIQUE: Multidetector CT imaging of the chest was performed using the standard protocol during bolus administration of intravenous contrast. Multiplanar CT image reconstructions and MIPs were obtained to evaluate the vascular anatomy. CONTRAST:  OMNIPAQUE IOHEXOL 350 MG/ML SOLN  COMPARISON:  None. FINDINGS: Cardiovascular: Heart size upper limits of normal. Aortic arch and great vessel origins are within normal limits. Pulmonary artery opacification is excellent. No focal filling defects are present to suggest pulmonary emboli. Mediastinum/Nodes: Enlarged paratracheal and AP window nodes are present. Right paratracheal node measures up to 13 mm in short axis. AP window node is 14 mm. Smaller prevascular nodes are present. No significant axillary or hilar adenopathy is present. Lungs/Pleura: Patchy ground-glass opacification is present throughout both lungs. No nodule or mass lesion is present. Airways are patent. There is some peribronchial thickening the lower lobes bilaterally. Small bilateral pleural effusions are noted. Upper Abdomen: Unremarkable. Musculoskeletal: Vertebral body heights and alignment are normal. No focal lytic or blastic lesions are present. Ribs are unremarkable. Review of the MIP images confirms the above findings. IMPRESSION: 1. No pulmonary embolus. 2. Patchy ground-glass opacification throughout both lungs compatible with a nonspecific infectious or inflammatory process. Peribronchial thickening suggests the same. 3. Small bilateral pleural effusions. 4. Mediastinal adenopathy is likely reactive. Electronically Signed   By: Marin Roberts M.D.   On: 12/08/2019 07:28     ____________________________________________   PROCEDURES  Procedure(s) performed:yes .1-3 Lead EKG Interpretation  Performed by: Nita Sickle, MD Authorized by: Nita Sickle, MD     Interpretation: non-specific     ECG rate assessment: tachycardic     Rhythm: sinus tachycardia     Ectopy: none     Conduction: normal     Critical Care performed: yes  CRITICAL CARE Performed by: Nita Sickle  ?  Total critical care time: 35 min  Critical care time was exclusive of separately billable procedures and treating other patients.  Critical care was  necessary to treat or prevent imminent or life-threatening deterioration.  Critical care was time spent personally by me on the following activities: development of treatment plan with patient and/or surrogate as well as nursing, discussions with consultants, evaluation of patient's response to treatment, examination of patient, obtaining history from patient or surrogate, ordering and performing treatments and interventions, ordering and review of laboratory studies, ordering and review of radiographic studies, pulse oximetry and re-evaluation of patient's condition.  ____________________________________________   INITIAL IMPRESSION / ASSESSMENT AND PLAN / ED COURSE  49 y.o. male with a history of smoking hypertension presents for evaluation of hemoptysis, pleuritic chest pain and shortness of breath since yesterday.  Patient is tachypneic, tachycardic, not hypoxic, clear lungs on exam.  EKG showing sinus tachycardia with no acute ischemic changes.  Differential diagnosis includes PE versus bronchitis versus pneumonia versus malignancy versus congestive heart failure versus myocarditis versus pericarditis.  CT Angio of the chest pending.  Chest x-ray concerning for atypical infection versus edema, confirmed by radiology.  Patient is elevated BNP of 418, elevated troponin of 47.  Will hold off Lasix until the results of the CT is back in case patient has a large PE which is preload dependent.  Will hold off heparin in case elevated troponin is just demand ischemia in the setting of new CHF or infection.  Second troponin is pending.  Patient placed on telemetry for close monitoring.  Old medical records reviewed.  Care transferred to Dr. Mayford Knife at 7 AM.  _________________________ 7:33 AM on 12/08/2019 -----------------------------------------  CTA negative for PE consistent with patchy groundglass opacifications bilaterally.  Covid and flu are pending.  Will start with Rocephin and azithromycin  for possible pneumonia. Since patient has elevated troponin and BNP will admit to Hospitalist for eval of possible CAD/ CHF/ myocarditis.  Patient given IV morphine for pain.    _____________________________________________ Please note:  Patient was evaluated in Emergency Department today for the symptoms described in the history of present illness. Patient was evaluated in the context of the global COVID-19 pandemic, which necessitated consideration that the patient might be at risk for infection with the SARS-CoV-2 virus that causes COVID-19. Institutional protocols and algorithms that pertain to the evaluation of patients at risk for COVID-19 are in a state of rapid change based on information released by regulatory bodies including the CDC and federal and state organizations. These policies and algorithms were followed during the patient's care in the ED.  Some ED evaluations and interventions may be delayed as a result of limited staffing during the pandemic.   [place] Controlled Substance Database was reviewed by me. ____________________________________________   FINAL CLINICAL IMPRESSION(S) / ED DIAGNOSES   Final diagnoses:  Hemoptysis  Community acquired pneumonia, unspecified laterality  Demand ischemia (HCC)      NEW MEDICATIONS STARTED DURING THIS VISIT:  ED Discharge Orders    None       Note:  This document was prepared using Dragon voice recognition software and may include unintentional dictation  errors.    Don Perking, [place], MD 12/08/19 [id number]

## 2019-12-08 NOTE — ED Notes (Signed)
Spoke with Dr. Clyde Lundborg, MD concerning pt's BP. Hydralazine order only for SBP >165. Per Dr. Clyde Lundborg no new order, continue PO BP medications.

## 2019-12-09 DIAGNOSIS — J189 Pneumonia, unspecified organism: Secondary | ICD-10-CM

## 2019-12-09 LAB — ECHOCARDIOGRAM COMPLETE
Height: 73 in
Weight: 5120 oz

## 2019-12-09 LAB — CBC
HCT: 39.3 % (ref 39.0–52.0)
Hemoglobin: 13.4 g/dL (ref 13.0–17.0)
MCH: 31.2 pg (ref 26.0–34.0)
MCHC: 34.1 g/dL (ref 30.0–36.0)
MCV: 91.4 fL (ref 80.0–100.0)
Platelets: 351 10*3/uL (ref 150–400)
RBC: 4.3 MIL/uL (ref 4.22–5.81)
RDW: 13.3 % (ref 11.5–15.5)
WBC: 7.3 10*3/uL (ref 4.0–10.5)
nRBC: 0 % (ref 0.0–0.2)

## 2019-12-09 LAB — BASIC METABOLIC PANEL
Anion gap: 9 (ref 5–15)
BUN: 14 mg/dL (ref 6–20)
CO2: 24 mmol/L (ref 22–32)
Calcium: 8.9 mg/dL (ref 8.9–10.3)
Chloride: 108 mmol/L (ref 98–111)
Creatinine, Ser: 1.07 mg/dL (ref 0.61–1.24)
GFR calc Af Amer: >60 mL/min (ref >60)
GFR calc non Af Amer: >60 mL/min (ref >60)
Glucose, Bld: 112 mg/dL — ABNORMAL HIGH (ref 70–99)
Potassium: 4 mmol/L (ref 3.5–5.1)
Sodium: 141 mmol/L (ref 135–145)

## 2019-12-09 LAB — LIPID PANEL
Cholesterol: 172 mg/dL (ref 0–200)
HDL: 33 mg/dL — ABNORMAL LOW (ref >40)
LDL Cholesterol: 121 mg/dL — ABNORMAL HIGH (ref 0–99)
Total CHOL/HDL Ratio: 5.2 RATIO
Triglycerides: 92 mg/dL (ref <150)
VLDL: 18 mg/dL (ref 0–40)

## 2019-12-09 LAB — STREP PNEUMONIAE URINARY ANTIGEN: Strep Pneumo Urinary Antigen: NEGATIVE

## 2019-12-09 LAB — HIV ANTIBODY (ROUTINE TESTING W REFLEX): HIV Screen 4th Generation wRfx: NONREACTIVE

## 2019-12-09 LAB — GLUCOSE, CAPILLARY: Glucose-Capillary: 116 mg/dL — ABNORMAL HIGH (ref 70–99)

## 2019-12-09 MED ORDER — GUAIFENESIN-DM 100-10 MG/5ML PO SYRP
5.0000 mL | ORAL_SOLUTION | ORAL | Status: DC | PRN
  Administered 2019-12-09 – 2019-12-10 (×3): 5 mL via ORAL
  Filled 2019-12-09 (×3): qty 5

## 2019-12-09 MED ORDER — HYDRALAZINE HCL 20 MG/ML IJ SOLN
5.0000 mg | Freq: Once | INTRAMUSCULAR | Status: AC
  Administered 2019-12-09: 5 mg via INTRAVENOUS
  Filled 2019-12-09: qty 1

## 2019-12-09 MED ORDER — IPRATROPIUM-ALBUTEROL 0.5-2.5 (3) MG/3ML IN SOLN
3.0000 mL | Freq: Four times a day (QID) | RESPIRATORY_TRACT | Status: DC
  Administered 2019-12-10: 07:00:00 3 mL via RESPIRATORY_TRACT
  Filled 2019-12-09: qty 3

## 2019-12-09 NOTE — Progress Notes (Signed)
Patient ID: Manuel Welch, male   DOB: [birth year], 49 y.o.   MRN: [id number]  PROGRESS NOTE    Manuel Welch  VQQ:[id number] DOB: 04-23-1971 DOA: 12/08/2019 PCP: Patient, No Pcp Per   Brief Narrative:  49 year old male with history of hypertension, tobacco abuse presented with cough and shortness of breath on 12/08/2019.  Chest x-ray showed increased interstitial markings and mild vascular congestion.  COVID-19 test was negative.  CT of the chest was negative for pulmonary embolism but showed patchy groundglass opacification throughout both lungs compatible with a nonspecific infectious or inflammatory process.  He was started on Rocephin and Zithromax.  Assessment & Plan:   Bilateral community-acquired pneumonia -COVID-19 test was negative.  -  CT of the chest was negative for pulmonary embolism but showed patchy groundglass opacification throughout both lungs compatible with a nonspecific infectious or inflammatory process.   -Continue Rocephin and Zithromax.  Follow cultures.  Still has cough and some shortness of breath.  Afebrile since admission. -COVID-19 and influenza negative  Hypertension -Blood pressure on the high side.  Continue amlodipine  Elevated troponin -Troponins did not trend up significantly.  Currently has no chest pain.  Echo pending.  No further work-up needed.   Polysubstance abuse Tobacco abuse -Urine drug screen was positive for cocaine and opiate.  Social worker consult. -Counseled regarding tobacco cessation.  Nicotine patch.  Morbid obesity -Outpatient follow-up  DVT prophylaxis: SCDs Code Status: Full Family Communication: None at bedside Disposition Plan: Status is: Observation  The patient will require care spanning > 2 midnights and should be moved to inpatient because: Inpatient level of care appropriate due to severity of illness.  Still has cough and feels short of breath requiring IV antibiotics  Dispo: The patient is from: Home               Anticipated d/c is to: Home              Anticipated d/c date is: 1 day              Patient currently is not medically stable to d/c.  Consultants: None  Procedures: None  Antimicrobials: Rocephin and Zithromax from 12/08/2019 onwards   Subjective: Patient seen and examined the patient.  Still complains of cough with exertional shortness of breath.  No overnight fever or vomiting reported.  No chest pains.  Objective: Vitals:   12/09/19 0354 12/09/19 0500 12/09/19 0728 12/09/19 0738  BP: (!) 138/93   (!) 157/125  Pulse: 99   100  Resp: 20   16  Temp: 98.2 F (36.8 C)   98.1 F (36.7 C)  TempSrc:      SpO2: 97%  99% 93%  Weight:  (!) 146.5 kg    Height:       No intake or output data in the 24 hours ending 12/09/19 1122 Filed Weights   12/08/19 0401 12/09/19 0500  Weight: (!) 145.2 kg (!) 146.5 kg    Examination:  General exam: Appears calm and comfortable. Respiratory system: Bilateral decreased breath sounds at bases with some scattered crackles Cardiovascular system: S1 & S2 heard, Rate controlled Gastrointestinal system: Abdomen is morbidly obese, nondistended, soft and nontender. Normal bowel sounds heard. Extremities: No cyanosis, clubbing; trace lower extremity edema Central nervous system: Alert and oriented. No focal neurological deficits. Moving extremities Skin: No rashes, lesions or ulcers Psychiatry: Judgement and insight appear normal. Mood & affect appropriate.     Data Reviewed: I have personally reviewed following labs and imaging  studies  CBC: Recent Labs  Lab 12/08/19 0405 12/09/19 0527  WBC 7.9 7.3  HGB 13.9 13.4  HCT 40.6 39.3  MCV 91.0 91.4  PLT 363 351   Basic Metabolic Panel: Recent Labs  Lab 12/08/19 0405 12/09/19 0527  NA 138 141  K 3.9 4.0  CL 109 108  CO2 22 24  GLUCOSE 118* 112*  BUN 16 14  CREATININE 1.14 1.07  CALCIUM 8.7* 8.9   GFR: Estimated Creatinine Clearance: 129.9 mL/min (by C-G formula based on SCr of  1.07 mg/dL). Liver Function Tests: Recent Labs  Lab 12/08/19 0405  AST 19  ALT 26  ALKPHOS 77  BILITOT 0.9  PROT 6.5  ALBUMIN 3.6   No results for input(s): LIPASE, AMYLASE in the last 168 hours. No results for input(s): AMMONIA in the last 168 hours. Coagulation Profile: Recent Labs  Lab 12/08/19 0619  INR 1.1   Cardiac Enzymes: No results for input(s): CKTOTAL, CKMB, CKMBINDEX, TROPONINI in the last 168 hours. BNP (last 3 results) No results for input(s): PROBNP in the last 8760 hours. HbA1C: No results for input(s): HGBA1C in the last 72 hours. CBG: No results for input(s): GLUCAP in the last 168 hours. Lipid Profile: Recent Labs    12/09/19 0527  CHOL 172  HDL 33*  LDLCALC 121*  TRIG 92  CHOLHDL 5.2   Thyroid Function Tests: No results for input(s): TSH, T4TOTAL, FREET4, T3FREE, THYROIDAB in the last 72 hours. Anemia Panel: No results for input(s): VITAMINB12, FOLATE, FERRITIN, TIBC, IRON, RETICCTPCT in the last 72 hours. Sepsis Labs: No results for input(s): PROCALCITON, LATICACIDVEN in the last 168 hours.  Recent Results (from the past 240 hour(s))  Respiratory Panel by RT PCR (Flu A&B, Covid) - Nasopharyngeal Swab     Status: None   Collection Time: 12/08/19  8:00 AM   Specimen: Nasopharyngeal Swab  Result Value Ref Range Status   SARS Coronavirus 2 by RT PCR NEGATIVE NEGATIVE Final    Comment: (NOTE) SARS-CoV-2 target nucleic acids are NOT DETECTED.  The SARS-CoV-2 RNA is generally detectable in upper respiratoy specimens during the acute phase of infection. The lowest concentration of SARS-CoV-2 viral copies this assay can detect is 131 copies/mL. A negative result does not preclude SARS-Cov-2 infection and should not be used as the sole basis for treatment or other patient management decisions. A negative result may occur with  improper specimen collection/handling, submission of specimen other than nasopharyngeal swab, presence of viral  mutation(s) within the areas targeted by this assay, and inadequate number of viral copies (<131 copies/mL). A negative result must be combined with clinical observations, patient history, and epidemiological information. The expected result is Negative.  Fact Sheet for Patients:  https://www.moore.com/  Fact Sheet for Healthcare Providers:  https://www.young.biz/  This test is no t yet approved or cleared by the [place] FDA and  has been authorized for detection and/or diagnosis of SARS-CoV-2 by FDA under an Emergency Use Authorization (EUA). This EUA will remain  in effect (meaning this test can be used) for the duration of the COVID-19 declaration under Section 564(b)(1) of the Act, 21 U.S.C. section 360bbb-3(b)(1), unless the authorization is terminated or revoked sooner.     Influenza A by PCR NEGATIVE NEGATIVE Final   Influenza B by PCR NEGATIVE NEGATIVE Final    Comment: (NOTE) The Xpert Xpress SARS-CoV-2/FLU/RSV assay is intended as an aid in  the diagnosis of influenza from Nasopharyngeal swab specimens and  should not be used as a  sole basis for treatment. Nasal washings and  aspirates are unacceptable for Xpert Xpress SARS-CoV-2/FLU/RSV  testing.  Fact Sheet for Patients: https://www.moore.com/  Fact Sheet for Healthcare Providers: https://www.young.biz/  This test is not yet approved or cleared by the [place] FDA and  has been authorized for detection and/or diagnosis of SARS-CoV-2 by  FDA under an Emergency Use Authorization (EUA). This EUA will remain  in effect (meaning this test can be used) for the duration of the  Covid-19 declaration under Section 564(b)(1) of the Act, 21  U.S.C. section 360bbb-3(b)(1), unless the authorization is  terminated or revoked. Performed at [facility], [address] [place], [place] [address]   CULTURE, BLOOD (ROUTINE X 2) w  Reflex to ID Panel     Status: None (Preliminary result)   Collection Time: 12/08/19 12:56 PM   Specimen: BLOOD  Result Value Ref Range Status   Specimen Description BLOOD BLOOD LEFT HAND  Final   Special Requests   Final    BOTTLES DRAWN AEROBIC AND ANAEROBIC Blood Culture adequate volume   Culture   Final    NO GROWTH < 24 HOURS Performed at [facility], [address], [place], [place] [address]    Report Status PENDING  Incomplete  CULTURE, BLOOD (ROUTINE X 2) w Reflex to ID Panel     Status: None (Preliminary result)   Collection Time: 12/08/19 12:56 PM   Specimen: BLOOD  Result Value Ref Range Status   Specimen Description BLOOD BLOOD RIGHT HAND  Final   Special Requests   Final    BOTTLES DRAWN AEROBIC AND ANAEROBIC Blood Culture results may not be optimal due to an excessive volume of blood received in culture bottles   Culture   Final    NO GROWTH < 24 HOURS Performed at [facility], [address] [place], [place] [address]    Report Status PENDING  Incomplete         Radiology Studies: DG Chest 2 View  Result Date: 12/08/2019 CLINICAL DATA:  Chest pain shortness of breath hemoptysis EXAM: CHEST - 2 VIEW COMPARISON:  Nov 04, 2019 FINDINGS: The heart size and mediastinal contours is upper limits of normal. There is prominence of the central pulmonary vasculature. There is mildly increased interstitial markings seen predominantly at both lower lungs. No pleural effusion. No acute osseous abnormality. IMPRESSION: Mildly increased interstitial markings seen at both lung bases which could be due to asymmetric edema or infectious etiology. Mild pulmonary vascular congestion Electronically Signed   By: Jonna Clark M.D.   On: 12/08/2019 04:24   CT Angio Chest PE W and/or Wo Contrast  Result Date: 12/08/2019 CLINICAL DATA:  Hemoptysis.  Chest pain. EXAM: CT ANGIOGRAPHY CHEST WITH CONTRAST TECHNIQUE: Multidetector CT imaging of the chest was performed  using the standard protocol during bolus administration of intravenous contrast. Multiplanar CT image reconstructions and MIPs were obtained to evaluate the vascular anatomy. CONTRAST:  OMNIPAQUE IOHEXOL 350 MG/ML SOLN COMPARISON:  None. FINDINGS: Cardiovascular: Heart size upper limits of normal. Aortic arch and great vessel origins are within normal limits. Pulmonary artery opacification is excellent. No focal filling defects are present to suggest pulmonary emboli. Mediastinum/Nodes: Enlarged paratracheal and AP window nodes are present. Right paratracheal node measures up to 13 mm in short axis. AP window node is 14 mm. Smaller prevascular nodes are present. No significant axillary or hilar adenopathy is present. Lungs/Pleura: Patchy ground-glass opacification is present throughout both lungs. No nodule or mass  lesion is present. Airways are patent. There is some peribronchial thickening the lower lobes bilaterally. Small bilateral pleural effusions are noted. Upper Abdomen: Unremarkable. Musculoskeletal: Vertebral body heights and alignment are normal. No focal lytic or blastic lesions are present. Ribs are unremarkable. Review of the MIP images confirms the above findings. IMPRESSION: 1. No pulmonary embolus. 2. Patchy ground-glass opacification throughout both lungs compatible with a nonspecific infectious or inflammatory process. Peribronchial thickening suggests the same. 3. Small bilateral pleural effusions. 4. Mediastinal adenopathy is likely reactive. Electronically Signed   By: Marin Roberts M.D.   On: 12/08/2019 07:28   ECHOCARDIOGRAM COMPLETE  Result Date: 12/09/2019    ECHOCARDIOGRAM REPORT   Patient Name:   Manuel Welch Date of Exam: 12/08/2019 Medical Rec #:  [id number]   Height:       73.0 in Accession #:    [id number]  Weight:       320.0 lb Date of Birth:  08/18/1970   BSA:          2.627 m Patient Age:    48 years    BP:           140/104 mmHg Patient Gender: M           HR:            103 bpm. Exam Location:  [facility] Procedure: 2D Echo, Cardiac Doppler and Color Doppler Indications:     CHF-Acute Diastolic 428.31 / I50.31  History:         Patient has no prior history of Echocardiogram examinations.                  Risk Factors:Hypertension.  Sonographer:     Neysa Bonito Roar Referring Phys:  Wynona Neat NIU Diagnosing Phys: Yvonne Kendall MD IMPRESSIONS  1. Left ventricular ejection fraction, by estimation, is 30 to 35%. The left ventricle has moderate to severely decreased function. The left ventricle demonstrates global hypokinesis. The left ventricular internal cavity size was moderately dilated. There is mild left ventricular hypertrophy. Indeterminate diastolic filling due to E-A fusion.  2. Right ventricular systolic function is mildly reduced. The right ventricular size is mildly enlarged. Moderately increased right ventricular wall thickness. There is moderately elevated pulmonary artery systolic pressure.  3. Left atrial size was mildly dilated.  4. Right atrial size was mildly dilated.  5. The mitral valve is normal in structure. Moderate mitral valve regurgitation. No evidence of mitral stenosis.  6. Tricuspid valve regurgitation is mild to moderate.  7. The aortic valve has an indeterminant number of cusps. Aortic valve regurgitation is trivial. No aortic stenosis is present.  8. The inferior vena cava is dilated in size with <50% respiratory variability, suggesting right atrial pressure of 15 mmHg. FINDINGS  Left Ventricle: Left ventricular ejection fraction, by estimation, is 30 to 35%. The left ventricle has moderate to severely decreased function. The left ventricle demonstrates global hypokinesis. The left ventricular internal cavity size was moderately  dilated. There is mild left ventricular hypertrophy. Indeterminate diastolic filling due to E-A fusion. Right Ventricle: The right ventricular size is mildly enlarged. Moderately increased right ventricular wall thickness. Right  ventricular systolic function is mildly reduced. There is moderately elevated pulmonary artery systolic pressure. The tricuspid regurgitant velocity is 2.93 m/s, and with an assumed right atrial pressure of 15 mmHg, the estimated right ventricular systolic pressure is 49.3 mmHg. Left Atrium: Left atrial size was mildly dilated. Right Atrium: Right atrial size was mildly dilated. Pericardium: Trivial pericardial effusion  is present. Mitral Valve: The mitral valve is normal in structure. Moderate mitral valve regurgitation. No evidence of mitral valve stenosis. Tricuspid Valve: The tricuspid valve is normal in structure. Tricuspid valve regurgitation is mild to moderate. Aortic Valve: The aortic valve has an indeterminant number of cusps. Aortic valve regurgitation is trivial. No aortic stenosis is present. Aortic valve mean gradient measures 2.0 mmHg. Aortic valve peak gradient measures 4.1 mmHg. Aortic valve area, by VTI measures 3.08 cm. Pulmonic Valve: The pulmonic valve was normal in structure. Pulmonic valve regurgitation is mild. No evidence of pulmonic stenosis. Aorta: The aortic root is normal in size and structure. Venous: The inferior vena cava is dilated in size with less than 50% respiratory variability, suggesting right atrial pressure of 15 mmHg. IAS/Shunts: The interatrial septum was not well visualized.  LEFT VENTRICLE PLAX 2D LVIDd:         6.07 cm  Diastology LVIDs:         5.03 cm  LV e' lateral:   7.72 cm/s LV PW:         1.27 cm  LV E/e' lateral: 16.1 LV IVS:        1.34 cm  LV e' medial:    7.28 cm/s LVOT diam:     1.90 cm  LV E/e' medial:  17.0 LV SV:         36 LV SV Index:   14 LVOT Area:     2.84 cm  RIGHT VENTRICLE RV Mid diam:    4.61 cm RV S prime:     11.50 cm/s TAPSE (M-mode): 1.9 cm LEFT ATRIUM              Index       RIGHT ATRIUM           Index LA diam:        5.00 cm  1.90 cm/m  RA Area:     21.30 cm LA Vol (A2C):   83.6 ml  31.82 ml/m RA Volume:   67.20 ml  25.58 ml/m LA Vol  (A4C):   124.0 ml 47.20 ml/m LA Biplane Vol: 102.0 ml 38.82 ml/m  AORTIC VALVE                   PULMONIC VALVE AV Area (Vmax):    2.55 cm    PV Vmax:       0.90 m/s AV Area (Vmean):   2.57 cm    PV Peak grad:  3.2 mmHg AV Area (VTI):     3.08 cm AV Vmax:           101.00 cm/s AV Vmean:          64.900 cm/s AV VTI:            0.117 m AV Peak Grad:      4.1 mmHg AV Mean Grad:      2.0 mmHg LVOT Vmax:         90.70 cm/s LVOT Vmean:        58.800 cm/s LVOT VTI:          0.127 m LVOT/AV VTI ratio: 1.09  AORTA Ao Root diam: 2.60 cm MV E velocity: 124.00 cm/s  TRICUSPID VALVE                             TR Peak grad:   34.3 mmHg  TR Vmax:        293.00 cm/s                              SHUNTS                             Systemic VTI:  0.13 m                             Systemic Diam: 1.90 cm Yvonne Kendall MD Electronically signed by Yvonne Kendall MD Signature Date/Time: 12/09/2019/9:58:50 AM    Final         Scheduled Meds: . amLODipine  5 mg Oral Daily  . azithromycin  250 mg Oral Daily  . dextromethorphan-guaiFENesin  1 tablet Oral BID  . ipratropium-albuterol  3 mL Nebulization Q4H  . nicotine  21 mg Transdermal Daily   Continuous Infusions: . cefTRIAXone (ROCEPHIN)  IV 2 g (12/09/19 1014)          Glade Lloyd, MD [facility] 12/09/2019, 11:22 AM

## 2019-12-10 ENCOUNTER — Telehealth: Payer: Self-pay | Admitting: General Practice

## 2019-12-10 DIAGNOSIS — I429 Cardiomyopathy, unspecified: Secondary | ICD-10-CM

## 2019-12-10 DIAGNOSIS — F1490 Cocaine use, unspecified, uncomplicated: Secondary | ICD-10-CM

## 2019-12-10 LAB — BASIC METABOLIC PANEL
Anion gap: 8 (ref 5–15)
BUN: 15 mg/dL (ref 6–20)
CO2: 25 mmol/L (ref 22–32)
Calcium: 8.9 mg/dL (ref 8.9–10.3)
Chloride: 109 mmol/L (ref 98–111)
Creatinine, Ser: 1.09 mg/dL (ref 0.61–1.24)
GFR calc Af Amer: >60 mL/min (ref >60)
GFR calc non Af Amer: >60 mL/min (ref >60)
Glucose, Bld: 112 mg/dL — ABNORMAL HIGH (ref 70–99)
Potassium: 3.8 mmol/L (ref 3.5–5.1)
Sodium: 142 mmol/L (ref 135–145)

## 2019-12-10 LAB — CBC WITH DIFFERENTIAL/PLATELET
Abs Immature Granulocytes: 0.03 10*3/uL (ref 0.00–0.07)
Basophils Absolute: 0.1 10*3/uL (ref 0.0–0.1)
Basophils Relative: 1 %
Eosinophils Absolute: 0.5 10*3/uL (ref 0.0–0.5)
Eosinophils Relative: 7 %
HCT: 39.4 % (ref 39.0–52.0)
Hemoglobin: 13.6 g/dL (ref 13.0–17.0)
Immature Granulocytes: 0 %
Lymphocytes Relative: 27 %
Lymphs Abs: 1.8 10*3/uL (ref 0.7–4.0)
MCH: 31.6 pg (ref 26.0–34.0)
MCHC: 34.5 g/dL (ref 30.0–36.0)
MCV: 91.6 fL (ref 80.0–100.0)
Monocytes Absolute: 0.4 10*3/uL (ref 0.1–1.0)
Monocytes Relative: 5 %
Neutro Abs: 4.1 10*3/uL (ref 1.7–7.7)
Neutrophils Relative %: 60 %
Platelets: 358 10*3/uL (ref 150–400)
RBC: 4.3 MIL/uL (ref 4.22–5.81)
RDW: 13.5 % (ref 11.5–15.5)
WBC: 6.9 10*3/uL (ref 4.0–10.5)
nRBC: 0 % (ref 0.0–0.2)

## 2019-12-10 LAB — MAGNESIUM: Magnesium: 2 mg/dL (ref 1.7–2.4)

## 2019-12-10 LAB — HEMOGLOBIN A1C
Hgb A1c MFr Bld: 5.4 % (ref 4.8–5.6)
Mean Plasma Glucose: 108 mg/dL

## 2019-12-10 MED ORDER — CEFDINIR 300 MG PO CAPS
300.0000 mg | ORAL_CAPSULE | Freq: Two times a day (BID) | ORAL | 0 refills | Status: AC
Start: 2019-12-11 — End: 2019-12-13

## 2019-12-10 MED ORDER — LISINOPRIL 10 MG PO TABS
10.0000 mg | ORAL_TABLET | Freq: Every day | ORAL | Status: DC
  Administered 2019-12-10: 10 mg via ORAL
  Filled 2019-12-10: qty 1

## 2019-12-10 MED ORDER — AMLODIPINE BESYLATE 10 MG PO TABS: 10.0000 mg | ORAL_TABLET | Freq: Every day | ORAL | 1 refills | Status: DC

## 2019-12-10 MED ORDER — AZITHROMYCIN 250 MG PO TABS: 250.0000 mg | ORAL_TABLET | Freq: Every day | ORAL | 0 refills | Status: AC

## 2019-12-10 MED ORDER — NICOTINE 21 MG/24HR TD PT24: MEDICATED_PATCH | TRANSDERMAL | 0 refills | Status: DC

## 2019-12-10 MED ORDER — LISINOPRIL 10 MG PO TABS: 10.0000 mg | ORAL_TABLET | Freq: Every day | ORAL | 0 refills | Status: DC

## 2019-12-10 MED ORDER — AMLODIPINE BESYLATE 10 MG PO TABS: 10.0000 mg | ORAL_TABLET | Freq: Every day | ORAL | Status: DC

## 2019-12-10 MED ORDER — AMLODIPINE BESYLATE 5 MG PO TABS
5.0000 mg | ORAL_TABLET | Freq: Once | ORAL | Status: AC
  Administered 2019-12-10: 5 mg via ORAL
  Filled 2019-12-10: qty 1

## 2019-12-10 NOTE — Progress Notes (Signed)
Patient ID: Manuel Welch [facility] [place] [facility] [name] was admitted to the Hospital on 12/08/2019 and Discharged  12/10/2019 and should be excused from work/school   for 7 days starting 12/08/2019 , may return to work/school without any restrictions.  Alford Highland M.D on 12/10/2019,at 8:38 AM  [facility] [place] [facility]

## 2019-12-10 NOTE — Discharge Summary (Signed)
[facility] [place] [facility]   PATIENT NAME: Manuel Welch    MR#:  [id number]  DATE OF BIRTH:  [birth year]  DATE OF ADMISSION:  12/08/2019 ADMITTING PHYSICIAN: Glade Lloyd, MD  DATE OF DISCHARGE: 12/10/2019 11:00 AM  PRIMARY CARE PHYSICIAN: Patient, No Pcp Per    ADMISSION DIAGNOSIS:  Atypical pneumonia [J18.9] Demand ischemia (HCC) [I24.8] Hemoptysis [R04.2] Community acquired pneumonia, unspecified laterality [J18.9] Pneumonia [J18.9]  DISCHARGE DIAGNOSIS:  Principal Problem:   Atypical pneumonia Active Problems:   Hypertension   Elevated troponin   Tobacco abuse   Chest pain   Pneumonia   SECONDARY DIAGNOSIS:   Past Medical History:  Diagnosis Date   Hypertension     HOSPITAL COURSE:   1.  Multi lobar pneumonia.  Given Rocephin and Zithromax while here in the hospital.  Will prescribe 2 more days of Zithromax and Omnicef upon going home. 2.  Accelerated hypertension.  All his blood pressures here have been on the higher side.  In looking in the room his blood pressure cuff was way too small for his arm.  Asked the nurse to recheck his blood pressure with a larger cuff.  With positive cocaine in his urine toxicology unable to give beta-blocker at this time.  We will give Norvasc and lisinopril at this time.  Follow-up as outpatient.  Patient states that he needs to get home today. 3.  Cardiomyopathy.  EF low at 30 to 35% with moderate mitral regurgitation.  No signs of congestive heart failure currently.  Follow-up with cardiology as outpatient.  Prescribed lisinopril.  Beta-blocker contraindicated secondary to cocaine use. 4.  Elevated troponin secondary to multi lobar pneumonia 5.  Morbid obesity with a BMI of 42.47.  Weight loss needed 6.  Elevated troponin secondary to accelerated hypertension and demand ischemia   DISCHARGE CONDITIONS:   Satisfactory  CONSULTS OBTAINED:  None  DRUG ALLERGIES:  No Known Allergies  DISCHARGE  MEDICATIONS:   Allergies as of 12/10/2019   No Known Allergies     Medication List    TAKE these medications   acetaminophen 500 MG tablet Commonly known as: TYLENOL Take by mouth.   amLODipine 10 MG tablet Commonly known as: NORVASC Take 1 tablet (10 mg total) by mouth daily. Start taking on: December 11, 2019   azithromycin 250 MG tablet Commonly known as: ZITHROMAX Take 1 tablet (250 mg total) by mouth daily for 2 days. Start taking on: December 11, 2019   cefdinir 300 MG capsule Commonly known as: OMNICEF Take 1 capsule (300 mg total) by mouth 2 (two) times daily for 2 days. Start taking on: December 11, 2019   lisinopril 10 MG tablet Commonly known as: ZESTRIL Take 1 tablet (10 mg total) by mouth daily.   nicotine 21 mg/24hr patch Commonly known as: NICODERM CQ - dosed in mg/24 hours One 21 mg patch chest wall daily (okay to substitute generic patch)        DISCHARGE INSTRUCTIONS:   [facility] Follow-up Dr. Okey Dupre cardiology  If you experience worsening of your admission symptoms, develop shortness of breath, life threatening emergency, suicidal or homicidal thoughts you must seek medical attention immediately by calling 911 or calling your MD immediately  if symptoms less severe.  You Must read complete instructions/literature along with all the possible adverse reactions/side effects for all the Medicines you take and that have been prescribed to you. Take any new Medicines after you have completely understood and accept all the  possible adverse reactions/side effects.   Please note  You were cared for by a hospitalist during your hospital stay. If you have any questions about your discharge medications or the care you received while you were in the hospital after you are discharged, you can call the unit and asked to speak with the hospitalist on call if the hospitalist that took care of you is not available. Once you are discharged, your primary care  physician will handle any further medical issues. Please note that NO REFILLS for any discharge medications will be authorized once you are discharged, as it is imperative that you return to your primary care physician (or establish a relationship with a primary care physician if you do not have one) for your aftercare needs so that they can reassess your need for medications and monitor your lab values.    Today   CHIEF COMPLAINT:   Chief Complaint  Patient presents with   coughing up blood    HISTORY OF PRESENT ILLNESS:  Manuel Welch  is a 49 y.o. male came in with coughing up blood and found to have pneumonia   VITAL SIGNS:  Blood pressure (!) 154/106, pulse (!) 105, temperature 98 F (36.7 C), temperature source Oral, resp. rate 20, height 6\' 1"  (1.854 m), weight (!) 146 kg, SpO2 100 %.  I/O:    Intake/Output Summary (Last 24 hours) at 12/10/2019 1502 Last data filed at 12/10/2019 0945 Gross per 24 hour  Intake 340 ml  Output --  Net 340 ml    PHYSICAL EXAMINATION:  GENERAL:  49 y.o.-year-old patient lying in the bed with no acute distress.  EYES: Pupils equal, round, reactive to light and accommodation.  HEENT: Head atraumatic, normocephalic. Oropharynx and nasopharynx clear.   LUNGS: Normal breath sounds bilaterally, no wheezing, rales,rhonchi or crepitation. No use of accessory muscles of respiration.  CARDIOVASCULAR: S1, S2 normal. No murmurs, rubs, or gallops.  ABDOMEN: Soft, non-tender, non-distended. Bowel sounds present. No organomegaly or mass.  EXTREMITIES: trace pedal edema.  PSYCHIATRIC: The patient is alert and oriented x 3.  SKIN: No obvious rash, lesion, or ulcer.   DATA REVIEW:   CBC Recent Labs  Lab 12/10/19 0501  WBC 6.9  HGB 13.6  HCT 39.4  PLT 358    Chemistries  Recent Labs  Lab 12/08/19 0405 12/09/19 0527 12/10/19 0501  NA 138   < > 142  K 3.9   < > 3.8  CL 109   < > 109  CO2 22   < > 25  GLUCOSE 118*   < > 112*  BUN 16   < >  15  CREATININE 1.14   < > 1.09  CALCIUM 8.7*   < > 8.9  MG  --   --  2.0  AST 19  --   --   ALT 26  --   --   ALKPHOS 77  --   --   BILITOT 0.9  --   --    < > = values in this interval not displayed.    Microbiology Results  Results for orders placed or performed during the hospital encounter of 12/08/19  Respiratory Panel by RT PCR (Flu A&B, Covid) - Nasopharyngeal Swab     Status: None   Collection Time: 12/08/19  8:00 AM   Specimen: Nasopharyngeal Swab  Result Value Ref Range Status   SARS Coronavirus 2 by RT PCR NEGATIVE NEGATIVE Final    Comment: (NOTE) SARS-CoV-2 target nucleic acids  are NOT DETECTED.  The SARS-CoV-2 RNA is generally detectable in upper respiratoy specimens during the acute phase of infection. The lowest concentration of SARS-CoV-2 viral copies this assay can detect is 131 copies/mL. A negative result does not preclude SARS-Cov-2 infection and should not be used as the sole basis for treatment or other patient management decisions. A negative result may occur with  improper specimen collection/handling, submission of specimen other than nasopharyngeal swab, presence of viral mutation(s) within the areas targeted by this assay, and inadequate number of viral copies (<131 copies/mL). A negative result must be combined with clinical observations, patient history, and epidemiological information. The expected result is Negative.  Fact Sheet for Patients:  https://www.moore.com/  Fact Sheet for Healthcare Providers:  https://www.young.biz/  This test is no t yet approved or cleared by the [place] FDA and  has been authorized for detection and/or diagnosis of SARS-CoV-2 by FDA under an Emergency Use Authorization (EUA). This EUA will remain  in effect (meaning this test can be used) for the duration of the COVID-19 declaration under Section 564(b)(1) of the Act, 21 U.S.C. section 360bbb-3(b)(1), unless the  authorization is terminated or revoked sooner.     Influenza A by PCR NEGATIVE NEGATIVE Final   Influenza B by PCR NEGATIVE NEGATIVE Final    Comment: (NOTE) The Xpert Xpress SARS-CoV-2/FLU/RSV assay is intended as an aid in  the diagnosis of influenza from Nasopharyngeal swab specimens and  should not be used as a sole basis for treatment. Nasal washings and  aspirates are unacceptable for Xpert Xpress SARS-CoV-2/FLU/RSV  testing.  Fact Sheet for Patients: https://www.moore.com/  Fact Sheet for Healthcare Providers: https://www.young.biz/  This test is not yet approved or cleared by the [place] FDA and  has been authorized for detection and/or diagnosis of SARS-CoV-2 by  FDA under an Emergency Use Authorization (EUA). This EUA will remain  in effect (meaning this test can be used) for the duration of the  Covid-19 declaration under Section 564(b)(1) of the Act, 21  U.S.C. section 360bbb-3(b)(1), unless the authorization is  terminated or revoked. Performed at [facility], [address] [place], [place] [address]   CULTURE, BLOOD (ROUTINE X 2) w Reflex to ID Panel     Status: None (Preliminary result)   Collection Time: 12/08/19 12:56 PM   Specimen: BLOOD  Result Value Ref Range Status   Specimen Description BLOOD BLOOD LEFT HAND  Final   Special Requests   Final    BOTTLES DRAWN AEROBIC AND ANAEROBIC Blood Culture adequate volume   Culture   Final    NO GROWTH 2 DAYS Performed at [facility], [address] [place], [place] [address]    Report Status PENDING  Incomplete  CULTURE, BLOOD (ROUTINE X 2) w Reflex to ID Panel     Status: None (Preliminary result)   Collection Time: 12/08/19 12:56 PM   Specimen: BLOOD  Result Value Ref Range Status   Specimen Description BLOOD BLOOD RIGHT HAND  Final   Special Requests   Final    BOTTLES DRAWN AEROBIC AND ANAEROBIC Blood Culture results may not be optimal  due to an excessive volume of blood received in culture bottles   Culture   Final    NO GROWTH 2 DAYS Performed at [facility], [address], [place], [place] [address]    Report Status PENDING  Incomplete    RADIOLOGY:  ECHOCARDIOGRAM COMPLETE  Result Date: 12/09/2019    ECHOCARDIOGRAM REPORT  Patient Name:   DAMONTAE LOPPNOW Date of Exam: 12/08/2019 Medical Rec #:  [id number]   Height:       73.0 in Accession #:    [id number]  Weight:       320.0 lb Date of Birth:  02-Mar-1971   BSA:          2.627 m Patient Age:    48 years    BP:           140/104 mmHg Patient Gender: M           HR:           103 bpm. Exam Location:  [facility] Procedure: 2D Echo, Cardiac Doppler and Color Doppler Indications:     CHF-Acute Diastolic 428.31 / I50.31  History:         Patient has no prior history of Echocardiogram examinations.                  Risk Factors:Hypertension.  Sonographer:     Neysa Bonito Roar Referring Phys:  Wynona Neat NIU Diagnosing Phys: Yvonne Kendall MD IMPRESSIONS  1. Left ventricular ejection fraction, by estimation, is 30 to 35%. The left ventricle has moderate to severely decreased function. The left ventricle demonstrates global hypokinesis. The left ventricular internal cavity size was moderately dilated. There is mild left ventricular hypertrophy. Indeterminate diastolic filling due to E-A fusion.  2. Right ventricular systolic function is mildly reduced. The right ventricular size is mildly enlarged. Moderately increased right ventricular wall thickness. There is moderately elevated pulmonary artery systolic pressure.  3. Left atrial size was mildly dilated.  4. Right atrial size was mildly dilated.  5. The mitral valve is normal in structure. Moderate mitral valve regurgitation. No evidence of mitral stenosis.  6. Tricuspid valve regurgitation is mild to moderate.  7. The aortic valve has an indeterminant number of cusps. Aortic valve regurgitation is trivial. No aortic stenosis is present.  8.  The inferior vena cava is dilated in size with <50% respiratory variability, suggesting right atrial pressure of 15 mmHg. FINDINGS  Left Ventricle: Left ventricular ejection fraction, by estimation, is 30 to 35%. The left ventricle has moderate to severely decreased function. The left ventricle demonstrates global hypokinesis. The left ventricular internal cavity size was moderately  dilated. There is mild left ventricular hypertrophy. Indeterminate diastolic filling due to E-A fusion. Right Ventricle: The right ventricular size is mildly enlarged. Moderately increased right ventricular wall thickness. Right ventricular systolic function is mildly reduced. There is moderately elevated pulmonary artery systolic pressure. The tricuspid regurgitant velocity is 2.93 m/s, and with an assumed right atrial pressure of 15 mmHg, the estimated right ventricular systolic pressure is 49.3 mmHg. Left Atrium: Left atrial size was mildly dilated. Right Atrium: Right atrial size was mildly dilated. Pericardium: Trivial pericardial effusion is present. Mitral Valve: The mitral valve is normal in structure. Moderate mitral valve regurgitation. No evidence of mitral valve stenosis. Tricuspid Valve: The tricuspid valve is normal in structure. Tricuspid valve regurgitation is mild to moderate. Aortic Valve: The aortic valve has an indeterminant number of cusps. Aortic valve regurgitation is trivial. No aortic stenosis is present. Aortic valve mean gradient measures 2.0 mmHg. Aortic valve peak gradient measures 4.1 mmHg. Aortic valve area, by VTI measures 3.08 cm. Pulmonic Valve: The pulmonic valve was normal in structure. Pulmonic valve regurgitation is mild. No evidence of pulmonic stenosis. Aorta: The aortic root is normal in size and structure. Venous: The inferior vena cava is dilated in size  with less than 50% respiratory variability, suggesting right atrial pressure of 15 mmHg. IAS/Shunts: The interatrial septum was not well  visualized.  LEFT VENTRICLE PLAX 2D LVIDd:         6.07 cm  Diastology LVIDs:         5.03 cm  LV e' lateral:   7.72 cm/s LV PW:         1.27 cm  LV E/e' lateral: 16.1 LV IVS:        1.34 cm  LV e' medial:    7.28 cm/s LVOT diam:     1.90 cm  LV E/e' medial:  17.0 LV SV:         36 LV SV Index:   14 LVOT Area:     2.84 cm  RIGHT VENTRICLE RV Mid diam:    4.61 cm RV S prime:     11.50 cm/s TAPSE (M-mode): 1.9 cm LEFT ATRIUM              Index       RIGHT ATRIUM           Index LA diam:        5.00 cm  1.90 cm/m  RA Area:     21.30 cm LA Vol (A2C):   83.6 ml  31.82 ml/m RA Volume:   67.20 ml  25.58 ml/m LA Vol (A4C):   124.0 ml 47.20 ml/m LA Biplane Vol: 102.0 ml 38.82 ml/m  AORTIC VALVE                   PULMONIC VALVE AV Area (Vmax):    2.55 cm    PV Vmax:       0.90 m/s AV Area (Vmean):   2.57 cm    PV Peak grad:  3.2 mmHg AV Area (VTI):     3.08 cm AV Vmax:           101.00 cm/s AV Vmean:          64.900 cm/s AV VTI:            0.117 m AV Peak Grad:      4.1 mmHg AV Mean Grad:      2.0 mmHg LVOT Vmax:         90.70 cm/s LVOT Vmean:        58.800 cm/s LVOT VTI:          0.127 m LVOT/AV VTI ratio: 1.09  AORTA Ao Root diam: 2.60 cm MV E velocity: 124.00 cm/s  TRICUSPID VALVE                             TR Peak grad:   34.3 mmHg                             TR Vmax:        293.00 cm/s                              SHUNTS                             Systemic VTI:  0.13 m  Systemic Diam: 1.90 cm Yvonne Kendall MD Electronically signed by Yvonne Kendall MD Signature Date/Time: 12/09/2019/9:58:50 AM    Final     Management plans discussed with the patient, and he is in agreement.  CODE STATUS:     Code Status Orders  (From admission, onward)         Start     Ordered   12/09/19 0504  Full code  Continuous        12/09/19 0503        Code Status History    This patient has a current code status but no historical code status.   Advance Care Planning Activity       TOTAL TIME TAKING CARE OF THIS PATIENT: 34 minutes.    Alford Highland M.D on 12/10/2019 at 3:02 PM  Between 7am to 6pm - Pager - [phone]  After 6pm go to [email or web address] - password [email or web address]  [facility]  CC: Primary care physician; Patient, No Pcp Per

## 2019-12-10 NOTE — Plan of Care (Signed)

## 2019-12-10 NOTE — TOC Initial Note (Signed)
Transition of Care [facility]) - Initial/Assessment Note    Patient Details  Name: Manuel Welch MRN: [id number] Date of Birth: 1970-12-02  Transition of Care [facility]) CM/SW Contact:    Shelbie Hutching, RN Phone Number: 12/10/2019, 10:05 AM  Clinical Narrative:                 Patient admitted to the hospital with atypical pneumonia.  Patient would like to go home today.  MD will discharge patient once he has been given his blood pressure medication and had his BP rechecked.  Patient does not currently have insurance or a PCP. Patient would like to get his medications from [facility], application [facility] given to patient.  Instructed patient to fill out the paperwork and return so that he can continue to get prescriptions filled.  Patient also needs a PCP to follow up on his high blood pressure.  Referral given to [facility].   This RNCM also provided patient with the [facility] and low cost healthcare in [place].  If patient chooses not to use [facility] he can go to [facility] or [facility].   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  Patient lives with his uncle and cousin, a family member will be coming to pick him up today.    Expected Discharge Plan: Home/Self Care Barriers to Discharge: Barriers Resolved   Patient Goals and CMS Choice Patient states their goals for this hospitalization and ongoing recovery are:: To go home- wants to get back to work      Expected Discharge Plan and Services Expected Discharge Plan: Home/Self Care   Discharge Planning Services: CM Consult, Medication Assistance, [facility]   Living arrangements for the past 2 months: Single Family Home Expected Discharge Date: 12/10/19                                    Prior Living Arrangements/Services Living  arrangements for the past 2 months: Single Family Home Lives with:: Relatives Patient language and need for interpreter reviewed:: Yes Do you feel safe going back to the place where you live?: Yes      Need for Family Participation in Patient Care: No (Comment) Care giver support system in place?: Yes (comment)   Criminal Activity/Legal Involvement Pertinent to Current Situation/Hospitalization: No - Comment as needed  Activities of Daily Living Home Assistive Devices/Equipment: None ADL Screening (condition at time of admission) Patient's cognitive ability adequate to safely complete daily activities?: Yes Is the patient deaf or have difficulty hearing?: No Does the patient have difficulty seeing, even when wearing glasses/contacts?: No Does the patient have difficulty concentrating, remembering, or making decisions?: No Patient able to express need for assistance with ADLs?: Yes Does the patient have difficulty dressing or bathing?: No Independently performs ADLs?: Yes (appropriate for developmental age) Does the patient have difficulty walking or climbing stairs?: No Weakness of Legs: None Weakness of Arms/Hands: None  Permission Sought/Granted Permission sought to share information with : Case Manager, Other (comment) Permission granted to share information with : Yes, Verbal Permission Granted     Permission granted to share info w AGENCY: [facility]        Emotional Assessment Appearance:: Appears stated age Attitude/Demeanor/Rapport: Engaged Affect (  typically observed): Accepting Orientation: : Oriented to Self, Oriented to Place, Oriented to  Time, Oriented to Situation Alcohol / Substance Use: Not Applicable Psych Involvement: No (comment)  Admission diagnosis:  Atypical pneumonia [J18.9] Demand ischemia (HCC) [I24.8] Hemoptysis [R04.2] Community acquired pneumonia, unspecified laterality [J18.9] Pneumonia [J18.9] Patient Active  Problem List   Diagnosis Date Noted  . Pneumonia 12/09/2019  . Atypical pneumonia 12/08/2019  . Chest pain 12/08/2019  . Hypertension   . Elevated troponin   . Tobacco abuse    PCP:  Patient, No Pcp Per Pharmacy:   [id number] [place], [place] - [address] [place] [address] Phone: [phone] Fax: [phone]  [facility] [place], [place] - [address] [place] [address] Phone: [phone] Fax: [phone]     Social Determinants of Health (SDOH) Interventions    Readmission Risk Interventions No flowsheet data found.

## 2019-12-10 NOTE — Telephone Encounter (Signed)
Spoke with his mom about our clinic and gave her the address to come in and get an application and our phone number if Jimmie wants more info... probably call again to try and speak with Jimmie directly

## 2019-12-10 NOTE — Progress Notes (Signed)
   12/10/19 1025  Clinical Encounter Type  Visited With Patient  Visit Type Initial;Spiritual support;Social support  Referral From Chaplain  Consult/Referral To Chaplain  Visited Pt while rounding unit on 1C. Pt welcome Ch in the room. The visit was cut short because Pt received a phone call. Pt was getting ready to go home. Ch will follow-up with Pt.

## 2019-12-10 NOTE — Progress Notes (Signed)
Pt for discharge home.  No distress resp even and unlaboured. Sl x 2 d/cd/ instructions discussed with pt. meds diet activity and f/u discussed. Pt to go  To med management to pick up meds. Verbalize understanding. Waiting for ride to pick him up.

## 2019-12-10 NOTE — Discharge Instructions (Signed)
Community-Acquired Pneumonia, Adult Pneumonia is an infection of the lungs. It causes swelling in the airways of the lungs. Mucus and fluid may also build up inside the airways. One type of pneumonia can happen while a person is in a hospital. A different type can happen when a person is not in a hospital (community-acquired pneumonia).  What are the causes?  This condition is caused by germs (viruses, bacteria, or fungi). Some types of germs can be passed from one person to another. This can happen when you breathe in droplets from the cough or sneeze of an infected person. What increases the risk? You are more likely to develop this condition if you:  Have a long-term (chronic) disease, such as: ? Chronic obstructive pulmonary disease (COPD). ? Asthma. ? Cystic fibrosis. ? Congestive heart failure. ? Diabetes. ? Kidney disease.  Have HIV.  Have sickle cell disease.  Have had your spleen removed.  Do not take good care of your teeth and mouth (poor dental hygiene).  Have a medical condition that increases the risk of breathing in droplets from your own mouth and nose.  Have a weakened body defense system (immune system).  Are a smoker.  Travel to areas where the germs that cause this illness are common.  Are around certain animals or the places they live. What are the signs or symptoms?  A dry cough.  A wet (productive) cough.  Fever.  Sweating.  Chest pain. This often happens when breathing deeply or coughing.  Fast breathing or trouble breathing.  Shortness of breath.  Shaking chills.  Feeling tired (fatigue).  Muscle aches. How is this treated? Treatment for this condition depends on many things. Most adults can be treated at home. In some cases, treatment must happen in a hospital. Treatment may include:  Medicines given by mouth or through an IV tube.  Being given extra oxygen.  Respiratory therapy. In rare cases, treatment for very bad pneumonia  may include:  Using a machine to help you breathe.  Having a procedure to remove fluid from around your lungs. Follow these instructions at home: Medicines  Take over-the-counter and prescription medicines only as told by your doctor. ? Only take cough medicine if you are losing sleep.  If you were prescribed an antibiotic medicine, take it as told by your doctor. Do not stop taking the antibiotic even if you start to feel better. General instructions   Sleep with your head and neck raised (elevated). You can do this by sleeping in a recliner or by putting a few pillows under your head.  Rest as needed. Get at least 8 hours of sleep each night.  Drink enough water to keep your pee (urine) pale yellow.  Eat a healthy diet that includes plenty of vegetables, fruits, whole grains, low-fat dairy products, and lean protein.  Do not use any products that contain nicotine or tobacco. These include cigarettes, e-cigarettes, and chewing tobacco. If you need help quitting, ask your doctor.  Keep all follow-up visits as told by your doctor. This is important. How is this prevented? A shot (vaccine) can help prevent pneumonia. Shots are often suggested for:  People older than 47 years of age.  People older than 47 years of age who: ? Are having cancer treatment. ? Have long-term (chronic) lung disease. ? Have problems with their body's defense system. You may also prevent pneumonia if you take these actions:  Get the flu (influenza) shot every year.  Go to the dentist as   often as told.  Wash your hands often. If you cannot use soap and water, use hand sanitizer. Contact a doctor if:  You have a fever.  You lose sleep because your cough medicine does not help. Get help right away if:  You are short of breath and it gets worse.  You have more chest pain.  Your sickness gets worse. This is very serious if: ? You are an older adult. ? Your body's defense system is weak.  You  cough up blood. Summary  Pneumonia is an infection of the lungs.  Most adults can be treated at home. Some will need treatment in a hospital.  Drink enough water to keep your pee pale yellow.  Get at least 8 hours of sleep each night. This information is not intended to replace advice given to you by your health care provider. Make sure you discuss any questions you have with your health care provider. Document Revised: 09/25/2018 Document Reviewed: 01/31/2018 Elsevier Patient Education  2020 Elsevier Inc.  

## 2019-12-12 LAB — LEGIONELLA PNEUMOPHILA SEROGP 1 UR AG: L. pneumophila Serogp 1 Ur Ag: NEGATIVE

## 2019-12-13 LAB — CULTURE, BLOOD (ROUTINE X 2)
Culture: NO GROWTH
Culture: NO GROWTH
Special Requests: ADEQUATE

## 2019-12-17 ENCOUNTER — Telehealth: Payer: Self-pay | Admitting: General Practice

## 2019-12-17 NOTE — Telephone Encounter (Signed)
Called his mother again about making Manuel Welch a patient here... I think she has some memory issues so I asked for her sons number as she lives in [place] and he's here in [place]... said she'd call back after church but I don't believe she did (could have after I left and no one made a tele note). She said he was interested in our clinic but has transportation issues so I am going to send him an application with the highlighted necessary documents so he will only need to seek transportation for his actual appt

## 2019-12-23 ENCOUNTER — Telehealth: Payer: Self-pay | Admitting: Pharmacy Technician

## 2019-12-23 ENCOUNTER — Telehealth: Payer: Self-pay | Admitting: General Practice

## 2019-12-23 NOTE — Telephone Encounter (Addendum)
Spoke to his mom and got his number... left a message on his cell about becoming a patient  ----- Message from Benjamin Stain, CMA sent at 12/10/2019 10:05 AM EDT ----- Regarding: FW: New patient referral Referral from [facility]. Please call patient. See note below. ----- Message ----- From: Allayne Butcher, RN Sent: 12/10/2019   8:50 AM EDT To: Merlinda Frederick, CMA Subject: New patient referral                           Patient admitted for pneumonia and hypertension.  Patient will need follow up for HTN- blood pressure remains high.  No insurance.  Please call the patient he may have questions about the application.  I gave him the application to fill out. He said the best number for him was his mother at [phone] Thank you

## 2019-12-23 NOTE — Telephone Encounter (Signed)
Patient received a 30 day supply of medication.  Provided patient with new patient packet to obtain ongoing [facility] services.  [facility] must receive requested financial documentation within 30 days in order to determine eligibility and provide additional medication assistance.  Jeanita Carneiro J. Talasia Saulter Care Manager [facility] 

## 2019-12-24 ENCOUNTER — Ambulatory Visit: Payer: Self-pay | Admitting: Physician Assistant

## 2019-12-24 ENCOUNTER — Telehealth: Payer: Self-pay | Admitting: General Practice

## 2019-12-24 NOTE — Telephone Encounter (Signed)
Individual's mother has been contacted and informed of how to become a pt at [facility]. This was the fourth call, and no other calls regarding becoming a new pt will be made.

## 2019-12-31 ENCOUNTER — Telehealth: Payer: Self-pay | Admitting: General Practice

## 2019-12-31 NOTE — Telephone Encounter (Signed)
Individual has been contacted 3+ times regarding ED referral. No further attempts to contact individual will be made. 

## 2020-01-12 ENCOUNTER — Ambulatory Visit: Payer: Self-pay | Admitting: Cardiology

## 2020-01-13 ENCOUNTER — Encounter: Payer: Self-pay | Admitting: Cardiology

## 2020-02-17 ENCOUNTER — Telehealth: Payer: Self-pay | Admitting: Pharmacist

## 2020-02-17 NOTE — Telephone Encounter (Signed)
Patient failed to provide requested 2021 financial documentation. No additional medication assistance will be provided by [facility] without the required proof of income documentation. Patient notified by letter. Elisha Ponder Medication Management Assistant

## 2020-02-25 ENCOUNTER — Ambulatory Visit: Payer: Self-pay | Admitting: Pharmacy Technician

## 2020-02-25 DIAGNOSIS — Z79899 Other long term (current) drug therapy: Secondary | ICD-10-CM

## 2020-02-26 ENCOUNTER — Telehealth: Payer: Self-pay | Admitting: Pharmacy Technician

## 2020-02-26 ENCOUNTER — Other Ambulatory Visit: Payer: Self-pay

## 2020-02-26 NOTE — Progress Notes (Signed)
Met with patient completed financial assistance application for [place] due to recent hospital visit.  Patient agreed to be responsible for gathering financial information and forwarding to appropriate department in [facility].    Completed [facility] application and contract.  Patient agreed to all terms of the [facility] contract.    Patient approved to receive medication assistance at [facility] until time for re-certification in 5056, and as long as eligibility criteria continues to be met.    Provided patient with Civil engineer, contracting based on his particular needs.    [facility]

## 2020-02-26 NOTE — Telephone Encounter (Signed)
Received updated proof of income.  Patient eligible to receive medication assistance at [facility] until time for re-certification in 2022, and as long as eligibility requirements continue to be met. ° °Betty J. Kluttz °Care Manager °[facility] °

## 2020-03-11 ENCOUNTER — Ambulatory Visit: Payer: Self-pay

## 2020-03-11 ENCOUNTER — Encounter: Payer: Self-pay | Admitting: Gerontology

## 2020-03-11 ENCOUNTER — Other Ambulatory Visit: Payer: Self-pay

## 2020-03-11 ENCOUNTER — Ambulatory Visit: Payer: Self-pay | Admitting: Gerontology

## 2020-03-11 VITALS — BP 143/90 | HR 101 | Resp 16 | Wt 311.7 lb

## 2020-03-11 DIAGNOSIS — F17200 Nicotine dependence, unspecified, uncomplicated: Secondary | ICD-10-CM | POA: Insufficient documentation

## 2020-03-11 DIAGNOSIS — Z7689 Persons encountering health services in other specified circumstances: Secondary | ICD-10-CM | POA: Insufficient documentation

## 2020-03-11 DIAGNOSIS — I10 Essential (primary) hypertension: Secondary | ICD-10-CM

## 2020-03-11 MED ORDER — AMLODIPINE BESYLATE 10 MG PO TABS: 10.0000 mg | ORAL_TABLET | Freq: Every day | ORAL | 30 refills | Status: DC

## 2020-03-11 MED ORDER — AMLODIPINE BESYLATE 10 MG PO TABS: 10.0000 mg | ORAL_TABLET | Freq: Every day | ORAL | 1 refills | Status: DC

## 2020-03-11 MED ORDER — LISINOPRIL 10 MG PO TABS: 10.0000 mg | ORAL_TABLET | Freq: Every day | ORAL | 3 refills | Status: DC

## 2020-03-11 MED ORDER — LISINOPRIL 10 MG PO TABS: 10.0000 mg | ORAL_TABLET | Freq: Every day | ORAL | 1 refills | Status: DC

## 2020-03-11 NOTE — Patient Instructions (Signed)
DASH Eating Plan DASH stands for "Dietary Approaches to Stop Hypertension." The DASH eating plan is a healthy eating plan that has been shown to reduce high blood pressure (hypertension). It may also reduce your risk for type 2 diabetes, heart disease, and stroke. The DASH eating plan may also help with weight loss. What are tips for following this plan?  General guidelines  Avoid eating more than 2,300 mg (milligrams) of salt (sodium) a day. If you have hypertension, you may need to reduce your sodium intake to 1,500 mg a day.  Limit alcohol intake to no more than 1 drink a day for nonpregnant women and 2 drinks a day for men. One drink equals 12 oz of beer, 5 oz of wine, or 1 oz of hard liquor.  Work with your health care provider to maintain a healthy body weight or to lose weight. Ask what an ideal weight is for you.  Get at least 30 minutes of exercise that causes your heart to beat faster (aerobic exercise) most days of the week. Activities may include walking, swimming, or biking.  Work with your health care provider or diet and nutrition specialist (dietitian) to adjust your eating plan to your individual calorie needs. Reading food labels   Check food labels for the amount of sodium per serving. Choose foods with less than 5 percent of the Daily Value of sodium. Generally, foods with less than 300 mg of sodium per serving fit into this eating plan.  To find whole grains, look for the word "whole" as the first word in the ingredient list. Shopping  Buy products labeled as "low-sodium" or "no salt added."  Buy fresh foods. Avoid canned foods and premade or frozen meals. Cooking  Avoid adding salt when cooking. Use salt-free seasonings or herbs instead of table salt or sea salt. Check with your health care provider or pharmacist before using salt substitutes.  Do not fry foods. Cook foods using healthy methods such as baking, boiling, grilling, and broiling instead.  Cook with  heart-healthy oils, such as olive, canola, soybean, or sunflower oil. Meal planning  Eat a balanced diet that includes: ? 5 or more servings of fruits and vegetables each day. At each meal, try to fill half of your plate with fruits and vegetables. ? Up to 6-8 servings of whole grains each day. ? Less than 6 oz of lean meat, poultry, or fish each day. A 3-oz serving of meat is about the same size as a deck of cards. One egg equals 1 oz. ? 2 servings of low-fat dairy each day. ? A serving of nuts, seeds, or beans 5 times each week. ? Heart-healthy fats. Healthy fats called Omega-3 fatty acids are found in foods such as flaxseeds and coldwater fish, like sardines, salmon, and mackerel.  Limit how much you eat of the following: ? Canned or prepackaged foods. ? Food that is high in trans fat, such as fried foods. ? Food that is high in saturated fat, such as fatty meat. ? Sweets, desserts, sugary drinks, and other foods with added sugar. ? Full-fat dairy products.  Do not salt foods before eating.  Try to eat at least 2 vegetarian meals each week.  Eat more home-cooked food and less restaurant, buffet, and fast food.  When eating at a restaurant, ask that your food be prepared with less salt or no salt, if possible. What foods are recommended? The items listed may not be a complete list. Talk with your dietitian about   what dietary choices are best for you. Grains Whole-grain or whole-wheat bread. Whole-grain or whole-wheat pasta. Brown rice. Oatmeal. Quinoa. Bulgur. Whole-grain and low-sodium cereals. Pita bread. Low-fat, low-sodium crackers. Whole-wheat flour tortillas. Vegetables Fresh or frozen vegetables (raw, steamed, roasted, or grilled). Low-sodium or reduced-sodium tomato and vegetable juice. Low-sodium or reduced-sodium tomato sauce and tomato paste. Low-sodium or reduced-sodium canned vegetables. Fruits All fresh, dried, or frozen fruit. Canned fruit in natural juice (without  added sugar). Meat and other protein foods Skinless chicken or turkey. Ground chicken or turkey. Pork with fat trimmed off. Fish and seafood. Egg whites. Dried beans, peas, or lentils. Unsalted nuts, nut butters, and seeds. Unsalted canned beans. Lean cuts of beef with fat trimmed off. Low-sodium, lean deli meat. Dairy Low-fat (1%) or fat-free (skim) milk. Fat-free, low-fat, or reduced-fat cheeses. Nonfat, low-sodium ricotta or cottage cheese. Low-fat or nonfat yogurt. Low-fat, low-sodium cheese. Fats and oils Soft margarine without trans fats. Vegetable oil. Low-fat, reduced-fat, or light mayonnaise and salad dressings (reduced-sodium). Canola, safflower, olive, soybean, and sunflower oils. Avocado. Seasoning and other foods Herbs. Spices. Seasoning mixes without salt. Unsalted popcorn and pretzels. Fat-free sweets. What foods are not recommended? The items listed may not be a complete list. Talk with your dietitian about what dietary choices are best for you. Grains Baked goods made with fat, such as croissants, muffins, or some breads. Dry pasta or rice meal packs. Vegetables Creamed or fried vegetables. Vegetables in a cheese sauce. Regular canned vegetables (not low-sodium or reduced-sodium). Regular canned tomato sauce and paste (not low-sodium or reduced-sodium). Regular tomato and vegetable juice (not low-sodium or reduced-sodium). Pickles. Olives. Fruits Canned fruit in a light or heavy syrup. Fried fruit. Fruit in cream or butter sauce. Meat and other protein foods Fatty cuts of meat. Ribs. Fried meat. Bacon. Sausage. Bologna and other processed lunch meats. Salami. Fatback. Hotdogs. Bratwurst. Salted nuts and seeds. Canned beans with added salt. Canned or smoked fish. Whole eggs or egg yolks. Chicken or turkey with skin. Dairy Whole or 2% milk, cream, and half-and-half. Whole or full-fat cream cheese. Whole-fat or sweetened yogurt. Full-fat cheese. Nondairy creamers. Whipped toppings.  Processed cheese and cheese spreads. Fats and oils Butter. Stick margarine. Lard. Shortening. Ghee. Bacon fat. Tropical oils, such as coconut, palm kernel, or palm oil. Seasoning and other foods Salted popcorn and pretzels. Onion salt, garlic salt, seasoned salt, table salt, and sea salt. Worcestershire sauce. Tartar sauce. Barbecue sauce. Teriyaki sauce. Soy sauce, including reduced-sodium. Steak sauce. Canned and packaged gravies. Fish sauce. Oyster sauce. Cocktail sauce. Horseradish that you find on the shelf. Ketchup. Mustard. Meat flavorings and tenderizers. Bouillon cubes. Hot sauce and Tabasco sauce. Premade or packaged marinades. Premade or packaged taco seasonings. Relishes. Regular salad dressings. Where to find more information:  National Heart, Lung, and Blood Institute: www.nhlbi.nih.gov  American Heart Association: www.heart.org Summary  The DASH eating plan is a healthy eating plan that has been shown to reduce high blood pressure (hypertension). It may also reduce your risk for type 2 diabetes, heart disease, and stroke.  With the DASH eating plan, you should limit salt (sodium) intake to 2,300 mg a day. If you have hypertension, you may need to reduce your sodium intake to 1,500 mg a day.  When on the DASH eating plan, aim to eat more fresh fruits and vegetables, whole grains, lean proteins, low-fat dairy, and heart-healthy fats.  Work with your health care provider or diet and nutrition specialist (dietitian) to adjust your eating plan to your   individual calorie needs. This information is not intended to replace advice given to you by your health care provider. Make sure you discuss any questions you have with your health care provider. Document Revised: 05/18/2017 Document Reviewed: 05/29/2016 Elsevier Patient Education  2020 Elsevier Inc.  

## 2020-03-11 NOTE — Progress Notes (Signed)
Patient ID: Manuel Welch., male   DOB: [birth year], 49 y.o.   MRN: [id number]  Chief Complaint  Patient presents with  . Hypertension    Need refill of meds    HPI Manuel Welch. is a 49 y.o. male who presents to establish care and evaluation of his chronic condition. He has hypertension and takes 10 mg Amlodipine and 10 mg Lisinopril daily. He denies any side effects, doesn't check his blood pressure at home. He smokes  1/2 pack of cigarette daily and denies the desire to quit. He states that he drinks 6-7 forty ounces of beer every weekend. Overall, he states that he's doing well and offers no further complaint.    Past Medical History:  Diagnosis Date  . Hypertension     History reviewed. No pertinent surgical history.  Family History  Problem Relation Age of Onset  . Hypertension Mother   . Hypertension Father   . Hypertension Brother     Social History Social History   Tobacco Use  . Smoking status: Current Every Day Smoker    Packs/day: 0.50    Years: 29.00    Pack years: 14.50  . Smokeless tobacco: Never Used  Vaping Use  . Vaping Use: Never used  Substance Use Topics  . Alcohol use: Yes    Comment: `up to 3 40oz beer during week; up to 5 40oz beer during weekend   . Drug use: No    No Known Allergies  Current Outpatient Medications  Medication Sig Dispense Refill  . amLODipine (NORVASC) 10 MG tablet Take 1 tablet (10 mg total) by mouth daily. 90 tablet 1  . lisinopril (ZESTRIL) 10 MG tablet Take 1 tablet (10 mg total) by mouth daily. 90 tablet 1   No current facility-administered medications for this visit.    Review of Systems Review of Systems  Constitutional: Negative.   HENT: Negative.   Respiratory: Negative.   Cardiovascular: Negative.   Gastrointestinal: Negative.   Endocrine: Negative.   Genitourinary: Negative.   Musculoskeletal: Negative.   Skin: Negative.   Neurological: Negative.   Hematological: Negative.   Psychiatric/Behavioral:  Negative.     Blood pressure (!) 143/90, pulse (!) 101, resp. rate 16, weight (!) 311 lb 11.2 oz (141.4 kg), SpO2 98 %.  Physical Exam Physical Exam HENT:     Head: Normocephalic and atraumatic.     Mouth/Throat:     Comments: Deferred per Covid Protocol Eyes:     Extraocular Movements: Extraocular movements intact.     Pupils: Pupils are equal, round, and reactive to light.  Cardiovascular:     Rate and Rhythm: Normal rate and regular rhythm.     Pulses: Normal pulses.     Heart sounds: Normal heart sounds.  Pulmonary:     Effort: Pulmonary effort is normal.     Breath sounds: Normal breath sounds.  Abdominal:     General: Bowel sounds are normal.     Palpations: Abdomen is soft.  Genitourinary:    Comments: Deferred per Protocol Musculoskeletal:        General: Normal range of motion.     Cervical back: Normal range of motion.  Skin:    General: Skin is warm and dry.  Neurological:     General: No focal deficit present.     Mental Status: He is alert and oriented to person, place, and time. Mental status is at baseline.  Psychiatric:        Mood and Affect: Mood  normal.        Behavior: Behavior normal.        Thought Content: Thought content normal.        Judgment: Judgment normal.     Data Reviewed Labs and past medical history was reviewed.   Assessment and Plan  1. Encounter to establish care Routine lab will be checked - Lipid panel; Future  2. Essential hypertension - His blood pressure is not under control, his goal is less than 140/90. He will continue on current treatment regimen and DASH diet and exercise as tolerated. - amLODipine (NORVASC) 10 MG tablet; Take 1 tablet (10 mg total) by mouth daily.  Dispense: 90 tablet; Refill: 1 - lisinopril (ZESTRIL) 10 MG tablet; Take 1 tablet (10 mg total) by mouth daily.  Dispense: 90 tablet; Refill: 1  3. Smoking - He was encouraged on smoking cessation and was provided with [facility] Quit line  information.  Follow up: 05/27/2020 if symptoms worsen or fail to improve.   Joeann Steppe E Jesstin Studstill 03/14/2020, 7:46 PM

## 2020-03-16 ENCOUNTER — Ambulatory Visit: Payer: Self-pay | Admitting: Gerontology

## 2020-05-06 ENCOUNTER — Other Ambulatory Visit: Payer: Self-pay

## 2020-05-06 DIAGNOSIS — Z7689 Persons encountering health services in other specified circumstances: Secondary | ICD-10-CM

## 2020-05-07 LAB — LIPID PANEL
Chol/HDL Ratio: 4 ratio (ref 0.0–5.0)
Cholesterol, Total: 186 mg/dL (ref 100–199)
HDL: 46 mg/dL (ref >39)
LDL Chol Calc (NIH): 110 mg/dL — ABNORMAL HIGH (ref 0–99)
Triglycerides: 169 mg/dL — ABNORMAL HIGH (ref 0–149)
VLDL Cholesterol Cal: 30 mg/dL (ref 5–40)

## 2020-05-27 ENCOUNTER — Ambulatory Visit: Payer: Self-pay | Admitting: Obstetrics and Gynecology

## 2020-05-27 ENCOUNTER — Other Ambulatory Visit: Payer: Self-pay

## 2020-05-27 VITALS — BP 142/93 | HR 103 | Ht 73.0 in | Wt 314.9 lb

## 2020-05-27 DIAGNOSIS — I10 Essential (primary) hypertension: Secondary | ICD-10-CM

## 2020-05-27 MED ORDER — AMLODIPINE BESYLATE 10 MG PO TABS: 10.0000 mg | ORAL_TABLET | Freq: Every day | ORAL | 0 refills | Status: DC

## 2020-05-27 MED ORDER — LISINOPRIL 10 MG PO TABS: 10.0000 mg | ORAL_TABLET | Freq: Every day | ORAL | 0 refills | Status: DC

## 2020-05-27 NOTE — Progress Notes (Signed)
Patient ID: Manuel Welch., male   DOB: 07/18/1970, 49 y.o.   MRN: [id number]  Chief Complaint  Patient presents with  . Results    Results from bloodwork done at last visit  . Medication Refill    Will run out in about 3 weeks    HPI Manuel Welch. is a 49 y.o. male who presents to f/u hypertension and takes 10 mg Amlodipine and 10 mg Lisinopril daily. He denies any side effects, doesn't check his blood pressure at home. He smokes  1/2 pack of cigarette daily and denies the desire to quit. He states that he drinks 6-7 forty ounces of beer every weekend. Not minimizing sodium in his diet. Drinks mostly water Lipids 11/21 WNL except mildly elevated TGs and LDL. Normal BMP 6/21  Past Medical History:  Diagnosis Date  . Hypertension     No past surgical history on file.  Family History  Problem Relation Age of Onset  . Hypertension Mother   . Hypertension Father   . Hypertension Brother     Social History Social History   Tobacco Use  . Smoking status: Current Every Day Smoker    Packs/day: 0.50    Years: 29.00    Pack years: 14.50  . Smokeless tobacco: Never Used  Vaping Use  . Vaping Use: Never used  Substance Use Topics  . Alcohol use: Yes    Comment: `up to 3 40oz beer during week; up to 5 40oz beer during weekend   . Drug use: No    No Known Allergies  Current Outpatient Medications  Medication Sig Dispense Refill  . amLODipine (NORVASC) 10 MG tablet Take 1 tablet (10 mg total) by mouth daily. 90 tablet 1  . lisinopril (ZESTRIL) 10 MG tablet Take 1 tablet (10 mg total) by mouth daily. 90 tablet 1   No current facility-administered medications for this visit.    Review of Systems Review of Systems  Constitutional: Negative.   HENT: Negative.   Respiratory: Negative.   Cardiovascular: Negative.   Gastrointestinal: Negative.   Endocrine: Negative.   Genitourinary: Negative.   Musculoskeletal: Negative.   Skin: Negative.   Neurological:  Negative.   Hematological: Negative.   Psychiatric/Behavioral: Negative.     Blood pressure (!) 142/93, pulse (!) 103, height 6\' 1"  (1.854 m), weight (!) 314 lb 14.4 oz (142.8 kg), SpO2 96 %.  Physical Exam Physical Exam Pulmonary:     Effort: Pulmonary effort is normal.  Musculoskeletal:        General: Normal range of motion.     Cervical back: Normal range of motion.  Skin:    General: Skin is warm.  Neurological:     General: No focal deficit present.     Mental Status: He is alert and oriented to person, place, and time.  Psychiatric:        Mood and Affect: Mood normal.        Behavior: Behavior normal.        Thought Content: Thought content normal.        Judgment: Judgment normal.     Data Reviewed Labs and past medical history was reviewed.   Assessment and Plan Essential hypertension - Plan: lisinopril (ZESTRIL) 10 MG tablet, amLODipine (NORVASC) 10 MG tablet Pt's BP stable on meds, no side effects. Rx RF for 3 months; RTO in 3 months for f/u.   Return in about 3 months (around 08/25/2020) for HTN f/u.  Manuel Welch 05/27/2020, 6:16 PM

## 2020-05-27 NOTE — Patient Instructions (Signed)
I value your feedback and entrusting us with your care. If you get a [facility] patient survey, I would appreciate you taking the time to let us know about your experience today. Thank you!  As of May 29, 2019, your lab results will be released to your MyChart immediately, before I even have a chance to see them. Please give me time to review them and contact you if there are any abnormalities. Thank you for your patience.  

## 2020-07-22 ENCOUNTER — Telehealth: Payer: Self-pay | Admitting: Pharmacy Technician

## 2020-07-22 NOTE — Telephone Encounter (Signed)
Received updated proof of income.  Patient eligible to receive medication assistance at [facility] until time for re-certification in 1610, and as long as eligibility requirements continue to be met.  [facility]

## 2020-08-26 ENCOUNTER — Other Ambulatory Visit: Payer: Self-pay | Admitting: Gerontology

## 2020-08-26 ENCOUNTER — Ambulatory Visit: Payer: Self-pay | Admitting: Gerontology

## 2020-08-26 ENCOUNTER — Other Ambulatory Visit: Payer: Self-pay

## 2020-08-26 VITALS — BP 139/91 | HR 101 | Temp 96.8°F | Wt 300.3 lb

## 2020-08-26 DIAGNOSIS — I10 Essential (primary) hypertension: Secondary | ICD-10-CM

## 2020-08-26 MED ORDER — LISINOPRIL 10 MG PO TABS: 10.0000 mg | ORAL_TABLET | Freq: Every day | ORAL | 0 refills | Status: DC

## 2020-08-26 MED ORDER — AMLODIPINE BESYLATE 10 MG PO TABS: 10.0000 mg | ORAL_TABLET | Freq: Every day | ORAL | 0 refills | Status: DC

## 2020-08-26 MED ORDER — LISINOPRIL 10 MG PO TABS: 20.0000 mg | ORAL_TABLET | Freq: Every day | ORAL | 0 refills | Status: DC

## 2020-08-26 MED ORDER — LISINOPRIL 10 MG PO TABS: 20.0000 mg | ORAL_TABLET | Freq: Every day | ORAL | 1 refills | Status: DC

## 2020-08-26 NOTE — Patient Instructions (Signed)

## 2020-08-26 NOTE — Progress Notes (Signed)
[facility]   Progress Note: General Provider: Wolfgang Phoenix, NP  SUBJECTIVE:   Manuel Welch. is a 50 y.o. male who  has a past medical history of Hypertension.. Patient presents today to follow-up hypertension and medication refill. He states that he doesn't check his blood pressure at home but denies chest pain, palpitation, shortness of breath, vision changes or headaches. Elevated blood pressure and heart rate during visit. Repeat blood pressure and pulse was 139/91 HR 101. He states that he consumes a lot of bread, which might have contributed to his weight gain. He is not interested in losing weight at this time. 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 He smokes one pack every 2-3 days, he drinks 40 oz beer every other day, and he reports that he isn't willing to quit smoking or drinking alcohol. He states that he adheres to the medication regimen. He has no other complaints. Overall, he states that he's doing well and offers no further complaints.  Review of Systems  Constitutional: Negative.   HENT: Negative.   Eyes: Negative.   Respiratory: Negative.   Cardiovascular: Negative.   Gastrointestinal: Negative.   Genitourinary: Negative.   Musculoskeletal: Negative.   Neurological: Negative.   Endo/Heme/Allergies: Negative.   Psychiatric/Behavioral: Negative.      OBJECTIVE: BP (!) 139/91 (BP Location: Left Arm, Patient Position: Sitting, Cuff Size: Large)   Pulse (!) 101   Temp (!) 96.8 F (36 C)   Wt (!) 300 lb 4.8 oz (136.2 kg)   SpO2 96%   BMI 39.62 kg/m   Wt Readings from Last 3 Encounters:  08/26/20 (!) 300 lb 4.8 oz (136.2 kg)  05/27/20 (!) 314 lb 14.4 oz (142.8 kg)  03/11/20 (!) 311 lb 11.2 oz (141.4 kg)     Physical Exam Vitals reviewed.  Constitutional:      Appearance: Normal appearance. He is obese.  Eyes:     Pupils: Pupils are equal, round, and reactive to light.   Cardiovascular:     Rate and Rhythm: Regular rhythm. Tachycardia present.     Pulses: Normal pulses.     Heart sounds: Normal heart sounds.  Pulmonary:     Effort: Pulmonary effort is normal.     Breath sounds: Normal breath sounds.  Abdominal:     General: Bowel sounds are normal.  Musculoskeletal:     Cervical back: Normal range of motion and neck supple.  Skin:    General: Skin is warm and dry.  Neurological:     General: No focal deficit present.     Mental Status: He is alert and oriented to person, place, and time.  Psychiatric:        Mood and Affect: Mood normal.        Behavior: Behavior normal.     ASSESSMENT/PLAN:  1. Essential hypertension -Your  blood pressure is not controlled, blood pressure goal should be less than 130/80 -Advised DASH diet and daily exercise as tolerated. -Advised to check blood pressure and pulse 3 times a week and to bring log with next appointment -Lisinopril increased to 20 mg daily. - amLODipine (NORVASC) 10 MG tablet; Take 1 tablet (10 mg total) by mouth daily.  Dispense: 90 tablet; Refill: 0 - lisinopril (ZESTRIL) 10 MG tablet; Take 2 tablets (20 mg total) by mouth daily.  Dispense: 180 tablet; Refill: 1  2. Elevated heart rate with elevated blood pressure and diagnosis of  hypertension -Heart rate was elevated and he was asked to check blood pressure and heart rate at home and to increase fluid intake - EKG 12-Lead; Future - Comp Met (CMET); Future - Urinalysis; Future - Troponin I (High Sensitivity); Future -If tachycardia is persisten will refer him to cardiology   Follow-up in 4 weeks    The patient was given clear instructions to go to ER or return to medical center if symptoms do not improve, worsen or new problems develop. The patient verbalized understanding and agreed with plan of care.  Beulah Capobianco, [place]

## 2020-08-27 ENCOUNTER — Encounter: Payer: Self-pay | Admitting: Gerontology

## 2020-09-09 ENCOUNTER — Telehealth: Payer: Self-pay

## 2020-09-09 NOTE — Telephone Encounter (Signed)
Scheduled lab and f/u appt. Tried calling patient to let them know times/dates.

## 2020-09-23 ENCOUNTER — Other Ambulatory Visit: Payer: Self-pay

## 2020-09-30 ENCOUNTER — Ambulatory Visit: Payer: Self-pay

## 2020-10-14 ENCOUNTER — Other Ambulatory Visit (HOSPITAL_BASED_OUTPATIENT_CLINIC_OR_DEPARTMENT_OTHER): Payer: Self-pay

## 2020-11-05 ENCOUNTER — Other Ambulatory Visit: Payer: Self-pay

## 2020-12-02 ENCOUNTER — Other Ambulatory Visit: Payer: Self-pay

## 2020-12-02 ENCOUNTER — Ambulatory Visit: Payer: Self-pay | Admitting: Gerontology

## 2020-12-02 VITALS — BP 111/69 | HR 100 | Temp 97.2°F | Ht 73.0 in | Wt 274.0 lb

## 2020-12-02 DIAGNOSIS — I10 Essential (primary) hypertension: Secondary | ICD-10-CM

## 2020-12-02 DIAGNOSIS — F17200 Nicotine dependence, unspecified, uncomplicated: Secondary | ICD-10-CM

## 2020-12-02 DIAGNOSIS — R009 Unspecified abnormalities of heart beat: Secondary | ICD-10-CM

## 2020-12-02 MED ORDER — LISINOPRIL 20 MG PO TABS
ORAL_TABLET | ORAL | 1 refills | Status: DC
  Filled 2020-12-02 – 2021-01-21 (×2): qty 90, 90d supply, fill #0

## 2020-12-02 MED ORDER — AMLODIPINE BESYLATE 10 MG PO TABS
ORAL_TABLET | Freq: Every day | ORAL | 1 refills | Status: DC
  Filled 2020-12-02 – 2021-01-21 (×2): qty 90, 90d supply, fill #0

## 2020-12-02 NOTE — Patient Instructions (Signed)
Smoking Tobacco Information, Adult Smoking tobacco can be harmful to your health. Tobacco contains a poisonous (toxic), colorless chemical called nicotine. Nicotine is addictive. It changes the brain and can make it hard to stop smoking. Tobacco also has other toxicchemicals that can hurt your body and raise your risk of many cancers. How can smoking tobacco affect me? Smoking tobacco puts you at risk for: Cancer. Smoking is most commonly associated with lung cancer, but can also lead to cancer in other parts of the body. Chronic obstructive pulmonary disease (COPD). This is a long-term lung condition that makes it hard to breathe. It also gets worse over time. High blood pressure (hypertension), heart disease, stroke, or heart attack. Lung infections, such as pneumonia. Cataracts. This is when the lenses in the eyes become clouded. Digestive problems. This may include peptic ulcers, heartburn, and gastroesophageal reflux disease (GERD). Oral health problems, such as gum disease and tooth loss. Loss of taste and smell. Smoking can affect your appearance by causing: Wrinkles. Yellow or stained teeth, fingers, and fingernails. Smoking tobacco can also affect your social life, because: It may be challenging to find places to smoke when away from home. Many workplaces, [employer], hotels, and public places are tobacco-free. Smoking is expensive. This is due to the cost of tobacco and the long-term costs of treating health problems from smoking. Secondhand smoke may affect those around you. Secondhand smoke can cause lung cancer, breathing problems, and heart disease. Children of smokers have a higher risk for: Sudden infant death syndrome (SIDS). Ear infections. Lung infections. If you currently smoke tobacco, quitting now can help you: Lead a longer and healthier life. Look, smell, breathe, and feel better over time. Save money. Protect others from the harms of secondhand smoke. What actions  can I take to prevent health problems? Quit smoking  Do not start smoking. Quit if you already do. Make a plan to quit smoking and commit to it. Look for programs to help you and ask your health care provider for recommendations and ideas. Set a date and write down all the reasons you want to quit. Let your friends and family know you are quitting so they can help and support you. Consider finding friends who also want to quit. It can be easier to quit with someone else, so that you can support each other. Talk with your health care provider about using nicotine replacement medicines to help you quit, such as gum, lozenges, patches, sprays, or pills. Do not replace cigarette smoking with electronic cigarettes, which are commonly called e-cigarettes. The safety of e-cigarettes is not known, and some may contain harmful chemicals. If you try to quit but return to smoking, stay positive. It is common to slip up when you first quit, so take it one day at a time. Be prepared for cravings. When you feel the urge to smoke, chew gum or suck on hard candy.  Lifestyle Stay busy and take care of your body. Drink enough fluid to keep your urine pale yellow. Get plenty of exercise and eat a healthy diet. This can help prevent weight gain after quitting. Monitor your eating habits. Quitting smoking can cause you to have a larger appetite than when you smoke. Find ways to relax. Go out with friends or family to a movie or a restaurant where people do not smoke. Ask your health care provider about having regular tests (screenings) to check for cancer. This may include blood tests, imaging tests, and other tests. Find ways to manage  your stress, such as meditation, yoga, or exercise. Where to find support To get support to quit smoking, consider: Asking your health care provider for more information and resources. Taking classes to learn more about quitting smoking. Looking for local organizations that offer  resources about quitting smoking. Joining a support group for people who want to quit smoking in your local community. Calling the smokefree.gov counselor helpline: 1-800-Quit-Now (661) 243-6341) Where to find more information You may find more information about quitting smoking from: HelpGuide.org: www.helpguide.org Bank[employer]: smokefree.gov American Lung Association: www.lung.org Contact a health care provider if you: Have problems breathing. Notice that your lips, nose, or fingers turn blue. Have chest pain. Are coughing up blood. Feel faint or you pass out. Have other health changes that cause you to worry. Summary Smoking tobacco can negatively affect your health, the health of those around you, your finances, and your social life. Do not start smoking. Quit if you already do. If you need help quitting, ask your health care provider. Think about joining a support group for people who want to quit smoking in your local community. There are many effective programs that will help you to quit this behavior. This information is not intended to replace advice given to you by your health care provider. Make sure you discuss any questions you have with your healthcare provider. Document Revised: 04/27/2020 Document Reviewed: 04/27/2020 Elsevier Patient Education  2022 Elsevier Inc. Cooking With Less [employer] with less salt is one way to reduce the amount of sodium you get from food. Sodium is one of the elements that make up salt. It is found naturally in foods and is also added to certain foods. Depending on your condition and overall health, your health care provider or dietitian may recommend that you reduce your sodium intake. Most people should have less than 2,300 milligrams (mg) of sodium each day. If you have high blood pressure (hypertension), you may need to limit your sodium to 1,500 mg each day. Follow the tipsbelow to help reduce your sodium intake. What are tips for eating  less sodium? Reading food labels  Check the food label before buying or using packaged ingredients. Always check the label for the serving size and sodium content. Look for products with no more than 140 mg of sodium in one serving. Check the % Daily Value column to see what percent of the daily recommended amount of sodium is provided in one serving of the product. Foods with 5% or less in this column are considered low in sodium. Foods with 20% or higher are considered high in sodium. Do not choose foods with salt as one of the first three ingredients on the ingredients list. If salt is one of the first three ingredients, it usually means the item is high in sodium.  Shopping Buy sodium-free or low-sodium products. Look for the following words on food labels: Low-sodium. Sodium-free. Reduced-sodium. No salt added. Unsalted. Always check the sodium content even if foods are labeled as low-sodium or no salt added. Buy fresh foods. Cooking Use herbs, seasonings without salt, and spices as substitutes for salt. Use sodium-free baking soda when baking. Grill, braise, or roast foods to add flavor with less salt. Avoid adding salt to pasta, rice, or hot cereals. Drain and rinse canned vegetables, beans, and meat before use. Avoid adding salt when cooking sweets and desserts. Cook with low-sodium ingredients. What foods are high in sodium? Vegetables Regular canned vegetables (not low-sodium or reduced-sodium). Sauerkraut, pickled vegetables, and relishes. Olives.  [place] fries. Onion rings. Regular canned tomato sauce and paste. Regular tomato and vegetable juice. Frozenvegetables in sauces. Grains Instant hot cereals. Bread stuffing, pancake, and biscuit mixes. Croutons. Seasoned rice or pasta mixes. Noodle soup cups. Boxed or frozen macaroni and cheese. Regular salted crackers. Self-rising flour. Rolls. Bagels. Flourtortillas and wraps. Meats and other proteins Meat or fish that is salted,  canned, smoked, cured, spiced, or pickled. This includes bacon, ham, sausages, hot dogs, corned beef, chipped beef, meat loaves, salt pork, jerky, pickled herring, anchovies, regular canned tuna, andsardines. Salted nuts. Dairy Processed cheese and cheese spreads. Cheese curds. Blue cheese. Feta cheese.String cheese. Regular cottage cheese. Buttermilk. Canned milk. The items listed above may not be a complete list of foods high in sodium. Actual amounts of sodium may be different depending on processing. Contact a dietitian for more information. What foods are low in sodium? Fruits Fresh, frozen, or canned fruit with no sauce added. Fruit juice. Vegetables Fresh or frozen vegetables with no sauce added. "No salt added" canned vegetables. "No salt added" tomato sauce and paste. Low-sodium orreduced-sodium tomato and vegetable juice. Grains Noodles, pasta, quinoa, rice. Shredded or puffed wheat or puffed rice. Regular or quick oats (not instant). Low-sodium crackers. Low-sodium bread. Whole-grainbread and whole-grain pasta. Unsalted popcorn. Meats and other proteins Fresh or frozen whole meats, poultry (not injected with sodium), and fish with no sauce added. Unsalted nuts. Dried peas, beans, and lentils without added salt. Unsalted canned beans. Eggs. Unsalted nut butters. Low-sodium canned tunaor chicken. Dairy Milk. Soy milk. Yogurt. Low-sodium cheeses, such as Swiss, [address], [place], and [employer]. Sherbet or ice cream (keep to  cup per serving).Cream cheese. Fats and oils Unsalted butter or margarine. Other foods Homemade pudding. Sodium-free baking soda and baking powder. Herbs and spices.Low-sodium seasoning mixes. Beverages Coffee and tea. Carbonated beverages. The items listed above may not be a complete list of foods low in sodium. Actual amounts of sodium may be different depending on processing. Contact a dietitian for more information. What are some salt alternatives when  cooking? The following are herbs, seasonings, and spices that can be used instead of salt to flavor your food. Herbs should be fresh or dried. Do not choose packaged mixes. Next to the name of the herb, spice, or seasoning aresome examples of foods you can pair it with. Herbs [place] leaves - Soups, meat and vegetable dishes, and spaghetti sauce. Basil - [employer], soups, pasta, and fish dishes. Cilantro - Meat, poultry, and vegetable dishes. Chili powder - Marinades and Mexican dishes. Chives - Salad dressings and potato dishes. Cumin - Mexican dishes, couscous, and meat dishes. Dill - Fish dishes, sauces, and salads. Fennel - Meat and vegetable dishes, breads, and cookies. Garlic (do not use garlic salt) - [place] dishes, meat dishes, salad dressings, and sauces. Marjoram - Soups, potato dishes, and meat dishes. Oregano - Pizza and spaghetti sauce. Parsley - Salads, soups, pasta, and meat dishes. Rosemary - [place] dishes, salad dressings, soups, and red meats. Saffron - Fish dishes, pasta, and some poultry dishes. Sage - Stuffings and sauces. Tarragon - Fish and [employer]. Thyme - Stuffing, meat, and fish dishes. Seasonings Lemon juice - Fish dishes, poultry dishes, vegetables, and salads. Vinegar - Salad dressings, vegetables, and fish dishes. Spices Cinnamon - Sweet dishes, such as cakes, cookies, and puddings. Cloves - Gingerbread, puddings, and marinades for meats. Curry - Vegetable dishes, fish and poultry dishes, and stir-fry dishes. Ginger - Vegetable dishes, fish dishes, and stir-fry dishes. Nutmeg -  Pasta, vegetables, poultry, fish dishes, and custard. Summary Cooking with less salt is one way to reduce the amount of sodium that you get from food. Buy sodium-free or low-sodium products. Check the food label before using or buying packaged ingredients. Use herbs, seasonings without salt, and spices as substitutes for salt in foods. This information is not intended  to replace advice given to you by your health care provider. Make sure you discuss any questions you have with your healthcare provider. Document Revised: 05/28/2019 Document Reviewed: 05/28/2019 Elsevier Patient Education  2022 [employer].

## 2020-12-02 NOTE — Progress Notes (Signed)
Established Patient Office Visit  Subjective:  Patient ID: Manuel Welch., male    DOB: 1971/02/22  Age: 50 y.o. MRN: [id number]  CC:  Chief Complaint  Patient presents with   Medication Refill    "Big pink pills"  Also reports needs to get labs done.    HPI Manuel Welch. Is a 50 y/o male who has history of hypertension, presents for lab review. His LDL was 107 mg/dl, the rest of his lab were within normal limits. He states that he's complaint with his medications, but continues to Smokes 1 pack of cigarettes in 2 days, drinks 1 can of 40 oz beer daily and admits the desire to quit. He denies chest pain, palpitation, dizziness and peripheral edema. Overall, he states that he's doing well and offers no further complaint.  Past Medical History:  Diagnosis Date   Hypertension     No past surgical history on file.  Family History  Problem Relation Age of Onset   Hypertension Mother    Hypertension Father    Hypertension Brother     Social History   Socioeconomic History   Marital status: Single    Spouse name: Not on file   Number of children: Not on file   Years of education: Not on file   Highest education level: Not on file  Occupational History   Not on file  Tobacco Use   Smoking status: Every Day    Packs/day: 0.50    Years: 29.00    Pack years: 14.50    Types: Cigarettes   Smokeless tobacco: Never  Vaping Use   Vaping Use: Never used  Substance and Sexual Activity   Alcohol use: Yes    Comment: `up to 3 40oz beer during week; up to 5 40oz beer during weekend    Drug use: No   Sexual activity: Yes  Other Topics Concern   Not on file  Social History Narrative   Lives at home with mother.   Social Determinants of Health   Financial Resource Strain: Not on file  Food Insecurity: Not on file  Transportation Needs: Not on file  Physical Activity: Not on file  Stress: Not on file  Social Connections: Not on file  Intimate Partner Violence: Not on  file    Outpatient Medications Prior to Visit  Medication Sig Dispense Refill   amLODipine (NORVASC) 10 MG tablet TAKE ONE TABLET BY MOUTH EVERY DAY 90 tablet 0   lisinopril (ZESTRIL) 10 MG tablet Take 2 tablets (20 mg total) by mouth daily. 180 tablet 1   lisinopril (ZESTRIL) 20 MG tablet TAKE 1 TABLET ($RemoveB'20MG'tinoRBMP$ ) BY MOUTH EVERY DAY 90 tablet 1   No facility-administered medications prior to visit.    No Known Allergies  ROS Review of Systems  Constitutional: Negative.   Eyes: Negative.   Respiratory: Negative.    Skin: Negative.   Neurological: Negative.   Psychiatric/Behavioral: Negative.       Objective:    Physical Exam HENT:     Head: Normocephalic.  Eyes:     Extraocular Movements: Extraocular movements intact.     Conjunctiva/sclera: Conjunctivae normal.     Pupils: Pupils are equal, round, and reactive to light.  Cardiovascular:     Rate and Rhythm: Normal rate and regular rhythm.     Pulses: Normal pulses.     Heart sounds: Normal heart sounds.  Pulmonary:     Effort: Pulmonary effort is normal.     Breath sounds:  Normal breath sounds.  Skin:    General: Skin is warm.  Neurological:     General: No focal deficit present.     Mental Status: He is alert and oriented to person, place, and time. Mental status is at baseline.  Psychiatric:        Mood and Affect: Mood normal.        Behavior: Behavior normal.        Thought Content: Thought content normal.        Judgment: Judgment normal.    BP 111/69 (BP Location: Right Arm, Patient Position: Sitting, Cuff Size: Large)   Pulse 100   Temp (!) 97.2 F (36.2 C)   Ht $R'6\' 1"'AX$  (7.473 m)   Wt 274 lb (124.3 kg)   SpO2 96%   BMI 36.15 kg/m  Wt Readings from Last 3 Encounters:  12/02/20 274 lb (124.3 kg)  08/26/20 (!) 300 lb 4.8 oz (136.2 kg)  05/27/20 (!) 314 lb 14.4 oz (142.8 kg)   He lost 26 pounds in 3 months. He was encouraged to continue on his weight loss regimen.  Health Maintenance Due  Topic Date  Due   COVID-19 Vaccine (1) Never done   Pneumococcal Vaccine 61-68 Years old (1 - PCV) Never done   Hepatitis C Screening  Never done   COLONOSCOPY (Pts 45-61yrs Insurance coverage will need to be confirmed)  Never done    There are no preventive care reminders to display for this patient.  No results found for: TSH Lab Results  Component Value Date   WBC 6.9 12/10/2019   HGB 13.6 12/10/2019   HCT 39.4 12/10/2019   MCV 91.6 12/10/2019   PLT 358 12/10/2019   Lab Results  Component Value Date   NA 141 12/02/2020   K 4.0 12/02/2020   CO2 20 12/02/2020   GLUCOSE 96 12/02/2020   BUN 15 12/02/2020   CREATININE 1.17 12/02/2020   BILITOT 0.4 12/02/2020   ALKPHOS 104 12/02/2020   AST 16 12/02/2020   ALT 17 12/02/2020   PROT 7.1 12/02/2020   ALBUMIN 4.2 12/02/2020   CALCIUM 9.8 12/02/2020   ANIONGAP 8 12/10/2019   EGFR 77 12/02/2020   Lab Results  Component Value Date   CHOL 173 12/02/2020   Lab Results  Component Value Date   HDL 44 12/02/2020   Lab Results  Component Value Date   LDLCALC 107 (H) 12/02/2020   Lab Results  Component Value Date   TRIG 124 12/02/2020   Lab Results  Component Value Date   CHOLHDL 3.9 12/02/2020   Lab Results  Component Value Date   HGBA1C 5.1 12/02/2020      Assessment & Plan:    1. Essential hypertension - His blood pressure is under control, he will continue on current regimen, DASH diet and exercise as tolerated. - lisinopril (ZESTRIL) 20 MG tablet; TAKE 1 TABLET ($RemoveB'20MG'DgTcgsdH$ ) BY MOUTH EVERY DAY  Dispense: 90 tablet; Refill: 1 - amLODipine (NORVASC) 10 MG tablet; TAKE ONE TABLET BY MOUTH EVERY DAY  Dispense: 90 tablet; Refill: 1  2. Smoking -He was encouraged on smoking cessation, provided with [place] Quit line information.     Follow-up: Return in about 22 weeks (around 05/05/2021), or if symptoms worsen or fail to improve.    Kambry Takacs Jerold Coombe, NP

## 2020-12-03 ENCOUNTER — Other Ambulatory Visit: Payer: Self-pay

## 2020-12-03 LAB — COMPREHENSIVE METABOLIC PANEL
ALT: 17 IU/L (ref 0–44)
AST: 16 IU/L (ref 0–40)
Albumin/Globulin Ratio: 1.4 (ref 1.2–2.2)
Albumin: 4.2 g/dL (ref 4.0–5.0)
Alkaline Phosphatase: 104 IU/L (ref 44–121)
BUN/Creatinine Ratio: 13 (ref 9–20)
BUN: 15 mg/dL (ref 6–24)
Bilirubin Total: 0.4 mg/dL (ref 0.0–1.2)
CO2: 20 mmol/L (ref 20–29)
Calcium: 9.8 mg/dL (ref 8.7–10.2)
Chloride: 105 mmol/L (ref 96–106)
Creatinine, Ser: 1.17 mg/dL (ref 0.76–1.27)
Globulin, Total: 2.9 g/dL (ref 1.5–4.5)
Glucose: 96 mg/dL (ref 65–99)
Potassium: 4 mmol/L (ref 3.5–5.2)
Sodium: 141 mmol/L (ref 134–144)
Total Protein: 7.1 g/dL (ref 6.0–8.5)
eGFR: 77 mL/min/{1.73_m2} (ref >59)

## 2020-12-03 LAB — HEMOGLOBIN A1C
Est. average glucose Bld gHb Est-mCnc: 100 mg/dL
Hgb A1c MFr Bld: 5.1 % (ref 4.8–5.6)

## 2020-12-03 LAB — LIPID PANEL
Chol/HDL Ratio: 3.9 ratio (ref 0.0–5.0)
Cholesterol, Total: 173 mg/dL (ref 100–199)
HDL: 44 mg/dL (ref >39)
LDL Chol Calc (NIH): 107 mg/dL — ABNORMAL HIGH (ref 0–99)
Triglycerides: 124 mg/dL (ref 0–149)
VLDL Cholesterol Cal: 22 mg/dL (ref 5–40)

## 2020-12-10 ENCOUNTER — Other Ambulatory Visit: Payer: Self-pay

## 2021-01-06 ENCOUNTER — Other Ambulatory Visit: Payer: Self-pay

## 2021-01-21 ENCOUNTER — Other Ambulatory Visit: Payer: Self-pay

## 2021-02-19 ENCOUNTER — Ambulatory Visit (HOSPITAL_COMMUNITY)
Admission: EM | Admit: 2021-02-19 | Discharge: 2021-02-19 | Disposition: A | Discharge status: Home / Self Care | Payer: Self-pay | Attending: Emergency Medicine | Admitting: Emergency Medicine

## 2021-02-19 ENCOUNTER — Other Ambulatory Visit: Payer: Self-pay

## 2021-02-19 ENCOUNTER — Encounter (HOSPITAL_COMMUNITY): Payer: Self-pay

## 2021-02-19 DIAGNOSIS — R600 Localized edema: Secondary | ICD-10-CM

## 2021-02-19 DIAGNOSIS — I10 Essential (primary) hypertension: Secondary | ICD-10-CM

## 2021-02-19 DIAGNOSIS — Z9114 Patient's other noncompliance with medication regimen: Secondary | ICD-10-CM

## 2021-02-19 DIAGNOSIS — F17200 Nicotine dependence, unspecified, uncomplicated: Secondary | ICD-10-CM

## 2021-02-19 MED ORDER — HYDROCHLOROTHIAZIDE 25 MG PO TABS
25.0000 mg | ORAL_TABLET | Freq: Every morning | ORAL | 0 refills | Status: DC
  Filled 2021-02-19: qty 30, 30d supply, fill #0

## 2021-02-19 NOTE — Discharge Instructions (Addendum)
Mr. Margo Aye, it was a pleasure to meet you today.  I have reviewed your blood pressure medication list and see that you have been prescription prescribed 2 medications:  Lisinopril 20 mg once daily  Amlodipine 10 mg once daily  I am adding a third medication to your list, Hydrochlorothiazide 25 mg, that will help with the swelling in your lower legs and also significantly improve your blood pressure. Please take 1 tablet every day in the morning.    Please follow-up with your primary care clinic within the next 30 days to repeat your blood pressure and to receive further refills of this medications should they deem that appropriate.

## 2021-02-19 NOTE — ED Provider Notes (Addendum)
[facility]    CSN: [id number] Arrival date & time: 02/19/21  1006      History   Chief Complaint Chief Complaint  Patient presents with   Headache   Emesis    HPI Manuel Arts. is a 50 y.o. male.   Patient complains of significant headache and elevated blood pressure.  States headache started yesterday and occurred at the top of his head, was relieved with Goody powders.  Blood pressure today is elevated and patient also complains of swelling in his lower extremities.  Patient reports a diet that is heavily laden with sodium, mostly home-cooked.  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  Patient reports he receives his primary care through a free clinic in [place], states he currently takes 1 small white tablet for his blood pressure.  Per EMR, I see the patient has been prescribed lisinopril and amlodipine.  Patient continues to smoke cigarettes, states he is not interested in quitting at this time.  Patient denies chest pain, shortness of breath, changes in vision, tinnitus, fatigue.  States at this time he does not have a headache.  Patient was also noted to have an elevated heart rate today.   Headache Associated symptoms: vomiting   Emesis Associated symptoms: headaches    Past Medical History:  Diagnosis Date   Hypertension     Patient Active Problem List   Diagnosis Date Noted   Encounter to establish care 03/11/2020   Essential hypertension 03/11/2020   Smoking 03/11/2020   Cardiomyopathy (HCC)    Cocaine use    Obesity, Class III, BMI 40-49.9 (morbid obesity) (HCC)    Multifocal pneumonia 12/09/2019   Atypical pneumonia 12/08/2019   Chest pain 12/08/2019   Accelerated hypertension    Elevated troponin    Tobacco abuse     History reviewed. No pertinent surgical history.     Home Medications    Prior to Admission medications   Medication Sig Start Date End Date Taking?  Authorizing Provider  hydrochlorothiazide (HYDRODIURIL) 25 MG tablet Take 1 tablet (25 mg total) by mouth in the morning. 02/19/21 03/21/21 Yes Theadora Rama, PA-C  amLODipine (NORVASC) 10 MG tablet TAKE ONE TABLET BY MOUTH EVERY DAY 12/02/20 12/02/21  Iloabachie, Chioma E, NP  lisinopril (ZESTRIL) 20 MG tablet TAKE 1 TABLET (20MG ) BY MOUTH EVERY DAY 12/02/20 12/02/21  Iloabachie, Chioma E, NP    Family History Family History  Problem Relation Age of Onset   Hypertension Mother    Hypertension Father    Hypertension Brother     Social History Social History   Tobacco Use   Smoking status: Every Day    Packs/day: 0.50    Years: 29.00    Pack years: 14.50    Types: Cigarettes   Smokeless tobacco: Never  Vaping Use   Vaping Use: Never used  Substance Use Topics   Alcohol use: Yes    Comment: `up to 3 40oz beer during week; up to 5 40oz beer during weekend    Drug use: No     Allergies   Patient has no known allergies.   Review of Systems Review of Systems  Gastrointestinal:  Positive for vomiting.  Neurological:  Positive for headaches.    Physical Exam Triage Vital Signs ED Triage Vitals [02/19/21 1023]  Enc Vitals Group     BP (!) 156/105     Pulse Rate (!) 113  Resp 18     Temp 98.9 F (37.2 C)     Temp Source Oral     SpO2 94 %     Weight      Height      Head Circumference      Peak Flow      Pain Score 8     Pain Loc      Pain Edu?      Excl. in GC?    No data found.  Updated Vital Signs BP (!) 156/105 (BP Location: Left Arm)   Pulse (!) 113   Temp 98.9 F (37.2 C) (Oral)   Resp 18   SpO2 94%   Visual Acuity Right Eye Distance:   Left Eye Distance:   Bilateral Distance:    Right Eye Near:   Left Eye Near:    Bilateral Near:     Physical Exam Vitals and nursing note reviewed.  Constitutional:      Appearance: He is well-developed. He is obese.  HENT:     Head: Normocephalic and atraumatic.     Mouth/Throat:     Mouth: Mucous  membranes are moist.     Pharynx: Oropharynx is clear.  Eyes:     Extraocular Movements: Extraocular movements intact.     Pupils: Pupils are equal, round, and reactive to light.  Cardiovascular:     Rate and Rhythm: Regular rhythm. Tachycardia present.     Heart sounds: Normal heart sounds.  Pulmonary:     Effort: Pulmonary effort is normal.     Breath sounds: Normal breath sounds.  Abdominal:     General: Bowel sounds are normal.     Palpations: Abdomen is soft.  Musculoskeletal:        General: Normal range of motion.     Cervical back: Normal range of motion and neck supple.  Skin:    General: Skin is warm and dry.  Neurological:     Mental Status: He is alert.  Psychiatric:        Mood and Affect: Mood normal.        Speech: Speech normal.        Behavior: Behavior normal.     UC Treatments / Results  Labs (all labs ordered are listed, but only abnormal results are displayed) Labs Reviewed - No data to display  EKG   Radiology No results found.  Procedures ED EKG  Date/Time: 02/19/2021 10:52 AM Performed by: Theadora Rama, PA-C Authorized by: Theadora Rama, PA-C   (including critical care time)  Medications Ordered in UC Medications - No data to display  Initial Impression / Assessment and Plan / UC Course  I have reviewed the triage vital signs and the nursing notes.  Pertinent labs & imaging results that were available during my care of the patient were reviewed by me and considered in my medical decision making (see chart for details).     Controlled hypertension and patient with a history of cardiomyopathy, obesity, cocaine abuse, tobacco dependence and possible noncompliance with medication regimen.  Is my opinion that this patient has a low level of medical literacy.  We discussed avoiding salt in his diet and smoking cessation.  Have added hydrochlorothiazide to his regimen and provided him with a complete list of his blood pressure medications  reminding him to take them all daily as prescribed.  Verbalized understanding of plan and agreed to begin this medication.  Patient again states that he is not planning on quitting smoking  anytime soon.  Patient has been advised to follow-up with his primary within 30 days. Final Clinical Impressions(s) / UC Diagnoses   Final diagnoses:  Essential hypertension  Bilateral lower extremity edema  Tobacco dependence     Discharge Instructions      Manuel Welch, it was a pleasure to meet you today.  I have reviewed your medications and see that you have been prescription prescribed 2 medications: Lisinopril 20 mg once daily Amlodipine 10 mg once daily I am adding a third medication to your list that will help with the swelling in your lower legs and also significantly improve your blood pressure.  It is called hydrochlorothiazide.  Please take 1 tablet every day in the morning.  Please follow-up with your primary care clinic within the next 30 days to repeat your blood pressure and to receive further refills of this medications should they deem that appropriate.   ED Prescriptions     Medication Sig Dispense Auth. Provider   hydrochlorothiazide (HYDRODIURIL) 25 MG tablet Take 1 tablet (25 mg total) by mouth in the morning. 30 tablet Theadora Rama, PA-C      PDMP not reviewed this encounter.   Theadora Rama, PA-C 02/19/21 1145    Theadora Rama, PA-C 02/19/21 1238

## 2021-02-19 NOTE — ED Triage Notes (Signed)
Pt present headache and vomiting. Symptom started yesterday.

## 2021-02-19 NOTE — ED Notes (Signed)
EKG machine in use and unavailable at this time.  Provider notified.

## 2021-02-22 ENCOUNTER — Other Ambulatory Visit: Payer: Self-pay

## 2021-03-24 ENCOUNTER — Other Ambulatory Visit: Payer: Self-pay

## 2021-04-25 ENCOUNTER — Other Ambulatory Visit: Payer: Self-pay

## 2021-05-05 ENCOUNTER — Encounter: Payer: Self-pay | Admitting: Gerontology

## 2021-05-05 ENCOUNTER — Ambulatory Visit: Payer: Self-pay

## 2021-05-05 ENCOUNTER — Ambulatory Visit: Payer: Self-pay | Admitting: Gerontology

## 2021-05-05 ENCOUNTER — Other Ambulatory Visit: Payer: Self-pay

## 2021-05-05 VITALS — BP 156/111 | HR 89 | Temp 97.5°F | Resp 18 | Ht 73.0 in | Wt 296.0 lb

## 2021-05-05 DIAGNOSIS — I10 Essential (primary) hypertension: Secondary | ICD-10-CM

## 2021-05-05 MED ORDER — AMLODIPINE BESYLATE 10 MG PO TABS
ORAL_TABLET | Freq: Every day | ORAL | 0 refills | Status: DC
  Filled 2021-05-05: qty 90, 90d supply, fill #0

## 2021-05-05 MED ORDER — LISINOPRIL 20 MG PO TABS
ORAL_TABLET | ORAL | 0 refills | Status: DC
  Filled 2021-05-05: qty 90, 90d supply, fill #0

## 2021-05-05 NOTE — Patient Instructions (Signed)

## 2021-05-05 NOTE — Progress Notes (Signed)
Established Patient Office Visit  Subjective:  Patient ID: Krystopher Kuenzel., male    DOB: 1971/02/22  Age: 50 y.o. MRN: [id number]  CC:  Chief Complaint  Patient presents with   Follow-up   Hypertension    Patient states he needs medication refills.     HPI Niall Illes. is a 50 y/o male who has history of hypertension,presents for follow up and medication refill. He was last seen on 12/02/20 and has missed his follow up appointments. His 90 day prescription for Amlodipine  and 20 mg Lisinopril was last dispensed on 01/21/21, 25 mg  of Hctz ordered on 02/19/21 after his ED visit was never filled. Currently, he states that he's been out of his medication for 1 month, denies headache, chest pain, palpitation, headache and vision changes. He states that he continues to smoke 1 pack of cigarette in 3 days, and admits the desire to quit. He relocated to [facility] Overall, he states that he's doing well and offers no further complaint.  Past Medical History:  Diagnosis Date   Hypertension     Past Surgical History:  Procedure Laterality Date   NO PAST SURGERIES      Family History  Problem Relation Age of Onset   Hypertension Mother    Hypertension Father    Hypertension Brother     Social History   Socioeconomic History   Marital status: Single    Spouse name: Not on file   Number of children: Not on file   Years of education: Not on file   Highest education level: Not on file  Occupational History   Not on file  Tobacco Use   Smoking status: Every Day    Packs/day: 0.50    Years: 29.00    Pack years: 14.50    Types: Cigarettes   Smokeless tobacco: Never  Vaping Use   Vaping Use: Never used  Substance and Sexual Activity   Alcohol use: Yes    Comment: `up to 3 40oz beer during week; up to 5 40oz beer during weekend    Drug use: No   Sexual activity: Yes  Other Topics Concern   Not on file  Social History Narrative   Lives at home with mother.   Social  Determinants of Health   Financial Resource Strain: Not on file  Food Insecurity: No Food Insecurity   Worried About Charity fundraiser in the Last Year: Never true   Ran Out of Food in the Last Year: Never true  Transportation Needs: No Transportation Needs   Lack of Transportation (Medical): No   Lack of Transportation (Non-Medical): No  Physical Activity: Not on file  Stress: Not on file  Social Connections: Not on file  Intimate Partner Violence: Not on file    Outpatient Medications Prior to Visit  Medication Sig Dispense Refill   amLODipine (NORVASC) 10 MG tablet TAKE ONE TABLET BY MOUTH EVERY DAY 90 tablet 1   lisinopril (ZESTRIL) 20 MG tablet TAKE 1 TABLET ($RemoveB'20MG'wwSgRTER$ ) BY MOUTH EVERY DAY 90 tablet 1   hydrochlorothiazide (HYDRODIURIL) 25 MG tablet Take 1 tablet (25 mg total) by mouth once daily in the morning. 30 tablet 0   No facility-administered medications prior to visit.    Allergies  Allergen Reactions   Bee Venom     ROS Review of Systems  Constitutional: Negative.   Eyes: Negative.   Respiratory: Negative.    Cardiovascular: Negative.   Neurological: Negative.  Objective:    Physical Exam HENT:     Head: Normocephalic and atraumatic.     Mouth/Throat:     Mouth: Mucous membranes are moist.  Eyes:     Extraocular Movements: Extraocular movements intact.     Conjunctiva/sclera: Conjunctivae normal.     Pupils: Pupils are equal, round, and reactive to light.  Cardiovascular:     Rate and Rhythm: Normal rate and regular rhythm.     Pulses: Normal pulses.     Heart sounds: Normal heart sounds.  Pulmonary:     Effort: Pulmonary effort is normal.     Breath sounds: Normal breath sounds.  Neurological:     General: No focal deficit present.     Mental Status: He is alert and oriented to person, place, and time. Mental status is at baseline.  Psychiatric:        Mood and Affect: Mood normal.        Behavior: Behavior normal.        Thought Content:  Thought content normal.        Judgment: Judgment normal.    BP (!) 156/111 (BP Location: Right Arm, Patient Position: Sitting, Cuff Size: Large)   Pulse 89   Temp (!) 97.5 F (36.4 C) (Oral)   Resp 18   Ht $R'6\' 1"'sy$  (1.854 m)   Wt 296 lb (134.3 kg)   SpO2 94%   BMI 39.05 kg/m  Wt Readings from Last 3 Encounters:  05/05/21 296 lb (134.3 kg)  12/02/20 274 lb (124.3 kg)  08/26/20 (!) 300 lb 4.8 oz (136.2 kg)   Encouraged weight loss   Health Maintenance Due  Topic Date Due   COVID-19 Vaccine (1) Never done   Pneumococcal Vaccine 82-25 Years old (1 - PCV) Never done   Hepatitis C Screening  Never done   COLONOSCOPY (Pts 45-53yrs Insurance coverage will need to be confirmed)  Never done   INFLUENZA VACCINE  Never done    There are no preventive care reminders to display for this patient.  No results found for: TSH Lab Results  Component Value Date   WBC 6.9 12/10/2019   HGB 13.6 12/10/2019   HCT 39.4 12/10/2019   MCV 91.6 12/10/2019   PLT 358 12/10/2019   Lab Results  Component Value Date   NA 141 12/02/2020   K 4.0 12/02/2020   CO2 20 12/02/2020   GLUCOSE 96 12/02/2020   BUN 15 12/02/2020   CREATININE 1.17 12/02/2020   BILITOT 0.4 12/02/2020   ALKPHOS 104 12/02/2020   AST 16 12/02/2020   ALT 17 12/02/2020   PROT 7.1 12/02/2020   ALBUMIN 4.2 12/02/2020   CALCIUM 9.8 12/02/2020   ANIONGAP 8 12/10/2019   EGFR 77 12/02/2020   Lab Results  Component Value Date   CHOL 173 12/02/2020   Lab Results  Component Value Date   HDL 44 12/02/2020   Lab Results  Component Value Date   LDLCALC 107 (H) 12/02/2020   Lab Results  Component Value Date   TRIG 124 12/02/2020   Lab Results  Component Value Date   CHOLHDL 3.9 12/02/2020   Lab Results  Component Value Date   HGBA1C 5.1 12/02/2020      Assessment & Plan:   1. Essential hypertension - His blood pressure is not under control due to non compliance. He was encouraged to pick up medication from [facility]. He was educated on medication side effects and advised to notify clinic or go  to the ED. He was provided with information of a free clinic in [place] and encouraged to schedule an appointment. He was educated on signs and symptoms of Stroke and advised to go to the ED. He will continue on DASH diet, exercise as tolerated and smoking cessation. - amLODipine (NORVASC) 10 MG tablet; TAKE ONE TABLET BY MOUTH EVERY DAY  Dispense: 90 tablet; Refill: 0 - lisinopril (ZESTRIL) 20 MG tablet; TAKE 1 TABLET ($RemoveB'20MG'pAMcijNb$ ) BY MOUTH EVERY DAY  Dispense: 90 tablet; Refill: 0      Follow-up: Today was his last visit since he relocated to [facility]. [facility] wishes him well with his care.    Deneice Wack Jerold Coombe, NP

## 2021-06-29 ENCOUNTER — Other Ambulatory Visit: Payer: Self-pay

## 2021-06-29 ENCOUNTER — Ambulatory Visit: Payer: Self-pay | Attending: Family Medicine | Admitting: Family Medicine

## 2021-07-29 ENCOUNTER — Other Ambulatory Visit (HOSPITAL_COMMUNITY): Payer: Self-pay

## 2021-07-29 ENCOUNTER — Encounter (HOSPITAL_COMMUNITY): Payer: Self-pay

## 2021-07-29 ENCOUNTER — Emergency Department (HOSPITAL_COMMUNITY)
Admission: EM | Admit: 2021-07-29 | Discharge: 2021-07-29 | Disposition: A | Discharge status: Home / Self Care | Payer: Self-pay | Attending: Student | Admitting: Student

## 2021-07-29 ENCOUNTER — Other Ambulatory Visit: Payer: Self-pay

## 2021-07-29 ENCOUNTER — Emergency Department (HOSPITAL_COMMUNITY): Payer: Self-pay

## 2021-07-29 DIAGNOSIS — Z20822 Contact with and (suspected) exposure to covid-19: Secondary | ICD-10-CM | POA: Insufficient documentation

## 2021-07-29 DIAGNOSIS — F17210 Nicotine dependence, cigarettes, uncomplicated: Secondary | ICD-10-CM | POA: Insufficient documentation

## 2021-07-29 DIAGNOSIS — R059 Cough, unspecified: Secondary | ICD-10-CM | POA: Insufficient documentation

## 2021-07-29 DIAGNOSIS — R42 Dizziness and giddiness: Secondary | ICD-10-CM | POA: Insufficient documentation

## 2021-07-29 DIAGNOSIS — R079 Chest pain, unspecified: Secondary | ICD-10-CM | POA: Insufficient documentation

## 2021-07-29 DIAGNOSIS — J069 Acute upper respiratory infection, unspecified: Secondary | ICD-10-CM

## 2021-07-29 DIAGNOSIS — Z79899 Other long term (current) drug therapy: Secondary | ICD-10-CM | POA: Insufficient documentation

## 2021-07-29 DIAGNOSIS — I10 Essential (primary) hypertension: Secondary | ICD-10-CM | POA: Insufficient documentation

## 2021-07-29 DIAGNOSIS — M791 Myalgia, unspecified site: Secondary | ICD-10-CM | POA: Insufficient documentation

## 2021-07-29 LAB — RESP PANEL BY RT-PCR (FLU A&B, COVID) ARPGX2
Influenza A by PCR: NEGATIVE
Influenza B by PCR: NEGATIVE
SARS Coronavirus 2 by RT PCR: NEGATIVE

## 2021-07-29 MED ORDER — BENZONATATE 100 MG PO CAPS
200.0000 mg | ORAL_CAPSULE | Freq: Once | ORAL | Status: AC
  Administered 2021-07-29: 200 mg via ORAL
  Filled 2021-07-29: qty 2

## 2021-07-29 MED ORDER — BENZONATATE 100 MG PO CAPS
100.0000 mg | ORAL_CAPSULE | Freq: Three times a day (TID) | ORAL | 0 refills | Status: DC
  Filled 2021-07-29: qty 21, 7d supply, fill #0

## 2021-07-29 NOTE — TOC Transition Note (Signed)
Transition of Care [facility]) - CM/SW Discharge Note   Patient Details  Name: Manuel Welch. MRN: [id number] Date of Birth: 07/06/1970  Transition of Care [facility]) CM/SW Contact:  Lavenia Atlas, RN Phone Number: 07/29/2021, 12:40 PM   Clinical Narrative:    Patient presented to [facility] ED for c/o of cold and cough. RNCM spoke with patient at bedside. Patient resides with his mother who will be his transportation from the ED, however is currently at church until1p. Patient does not have any medical insurance. RNCM received TOC consult for medication assistance. Patient reports he has his amlodipine, HCTZ and lisinopril at home and does not need assistance. Patient would qualify for MATCH however due to the cost of the medication he will not utilize [facility] program today.  Patient is being prescribed:  benzonatate (TESSALON) 100 MG capsule  Sig: Take 1 capsule (100 mg total) by mouth every 8 (eight) hours.    Start: 07/29/21    Quantity: 21 capsule      This medication at [facility] outpatient pharmacy is $9. Patient states he does not have any money however he can get the $9 from his mother. No addition TOC needs at this time.       Barriers to Discharge: No Barriers Identified   Patient Goals and CMS Choice  Go home with medication to feel better.      Discharge Placement  Home    Discharge Plan and Services    [facility] consult for discharge planning             Social Determinants of Health (SDOH) Interventions     Readmission Risk Interventions No flowsheet data found.   ED from 07/29/2021 in [facility]   07/29/2021   1237  TOC ED Mini Assessment   TOC Time spent with patient (minutes): 30  TOC Time saved using PING (minutes): --  PING Used in TOC Assessment No  Admission or Readmission Diverted Yes  Interventions which prevented an admission or readmission Medication Review  What brought you to the Emergency Department?  Cold and cough since Tuesday  07/26/21  Barriers to Discharge No Barriers Identified  Barrier interventions Medication assistance/ review  Means of departure Car

## 2021-07-29 NOTE — ED Triage Notes (Signed)
Patient c/o a productive cough with yellow sputum, chills, dizziness x 3 days

## 2021-07-29 NOTE — ED Provider Notes (Addendum)
[facility]  CSN: [id number] Arrival date & time: 07/29/21 1010  Chief Complaint(s) Cough, Dizziness, and Chills  HPI Manuel Welch. is a 51 y.o. 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  Patient states that he has had approximately 3 days of symptoms with productive cough with yellow sputum but denies documented fever, abdominal pain, nausea, vomiting or other systemic symptoms.  Patient denies shortness of breath.  He states that he has chest pain only with coughing and he has no chest pain at rest or with exertion.  Patient endorses intermittent dizziness at home but here in the emergency department he does not have dizziness today.   Cough Dizziness  Past Medical History Past Medical History:  Diagnosis Date   Hypertension    Patient Active Problem List   Diagnosis Date Noted   Encounter to establish care 03/11/2020   Essential hypertension 03/11/2020   Smoking 03/11/2020   Cardiomyopathy (HCC)    Cocaine use    Obesity, Class III, BMI 40-49.9 (morbid obesity) (HCC)    Multifocal pneumonia 12/09/2019   Atypical pneumonia 12/08/2019   Chest pain 12/08/2019   Accelerated hypertension    Elevated troponin    Tobacco abuse    Home Medication(s) Prior to Admission medications   Medication Sig Start Date End Date Taking? Authorizing Provider  amLODipine (NORVASC) 10 MG tablet TAKE ONE TABLET BY MOUTH EVERY DAY 05/05/21 05/05/22  Iloabachie, Chioma E, NP  hydrochlorothiazide (HYDRODIURIL) 25 MG tablet Take 1 tablet (25 mg total) by mouth once daily in the morning. 02/19/21 03/21/21  Theadora Rama Scales, PA-C  lisinopril (ZESTRIL) 20 MG tablet TAKE 1 TABLET (20MG ) BY MOUTH EVERY DAY 05/05/21 05/05/22  Iloabachie, 05/07/22, NP                                                                                                                                     Past Surgical History Past Surgical History:  Procedure Laterality Date   NO PAST SURGERIES     Family History Family History  Problem Relation Age of Onset   Hypertension Mother    Hypertension Father    Hypertension Brother     Social History Social History   Tobacco Use   Smoking status: Every Day    Packs/day: 0.50    Years: 29.00    Pack years: 14.50    Types: Cigarettes   Smokeless tobacco: Never  Vaping Use   Vaping Use: Never used  Substance Use Topics   Alcohol use: Yes   Drug use: No   Allergies Bee venom  Review of Systems Review of Systems  Respiratory:  Positive for cough.   Neurological:  Positive for dizziness.   Physical Exam Vital Signs  I have reviewed the triage vital signs BP (!) 182/111 (BP  Location: Right Arm)    Pulse (!) 116    Temp 98.3 F (36.8 C) (Oral)    Resp 18    Ht 6\' 1"  (1.854 m)    Wt 132.9 kg    SpO2 98%    BMI 38.66 kg/m   Physical Exam Vitals and nursing note reviewed.  Constitutional:      General: He is not in acute distress.    Appearance: He is well-developed.  HENT:     Head: Normocephalic and atraumatic.  Eyes:     Conjunctiva/sclera: Conjunctivae normal.  Cardiovascular:     Rate and Rhythm: Normal rate and regular rhythm.     Heart sounds: No murmur heard. Pulmonary:     Effort: Pulmonary effort is normal. No respiratory distress.     Breath sounds: Normal breath sounds.  Abdominal:     Palpations: Abdomen is soft.     Tenderness: There is no abdominal tenderness.  Musculoskeletal:        General: No swelling.     Cervical back: Neck supple.  Skin:    General: Skin is warm and dry.     Capillary Refill: Capillary refill takes less than 2 seconds.  Neurological:     Mental Status: He is alert.  Psychiatric:        Mood and Affect: Mood normal.    ED Results and Treatments Labs (all labs ordered are listed, but only abnormal results are displayed) Labs Reviewed   RESP PANEL BY RT-PCR (FLU A&B, COVID) ARPGX2                                                                                                                          Radiology No results found.  Pertinent labs & imaging results that were available during my care of the patient were reviewed by me and considered in my medical decision making (see MDM for details).  Medications Ordered in ED Medications - No data to display                                                                                                                                   Procedures Procedures  (including critical care time)  Medical Decision Making / ED Course   This patient presents to the ED for concern of cough, chills, this involves an extensive number of treatment options, and is a complaint that carries with  it a high risk of complications and morbidity.  The differential diagnosis includes viral URI, influenza, pneumonia, COVID-19  MDM: Patient seen Emergency Department for evaluation of cough and chills.  Physical exam unremarkable.  No evidence of clinical heart failure.  COVID and flu negative.  Chest x-ray unremarkable.  Patient given Jerilynn Som and his cough improved.  Patient initially tachycardic and hypertensive on arrival but this resolved on reevaluation.  On reevaluation, no complaints of chest pain or shortness of breath.  Social work consulted to assist the patient with medication assistance as he states he is unable to afford [employer].  Medication assisted provided at the [facility] outpatient pharmacy.  Patient safe for outpatient follow-up and he was discharged.   Additional history obtained:  -External records from outside source obtained and reviewed including: Chart review including previous notes, labs, imaging, consultation notes   Lab Tests: -I ordered, reviewed, and interpreted labs.   The pertinent results include:   Labs Reviewed  RESP PANEL BY RT-PCR (FLU  A&B, COVID) [id number]      Imaging Studies ordered: I ordered imaging studies including chest XR I independently visualized and interpreted imaging. I agree with the radiologist interpretation   Medicines ordered and prescription drug management: No orders of the defined types were placed in this encounter.   -I have reviewed the patients home medicines and have made adjustments as needed  Critical interventions none  Consultations Obtained: I requested consultation with the social work,  and discussed lab and imaging findings as well as pertinent plan - they recommend: [place] outpatient pharmacy   Cardiac Monitoring: The patient was maintained on a cardiac monitor.  I personally viewed and interpreted the cardiac monitored which showed an underlying rhythm of: NSR  Social Determinants of Health:  Factors impacting patients care include: unable to afford medications    Reevaluation: After the interventions noted above, I reevaluated the patient and found that they have :improved  Co morbidities that complicate the patient evaluation  Past Medical History:  Diagnosis Date   Hypertension       Dispostion: I considered admission for this patient, but he is safe for outpatient follow-up in the setting of his viral URI.     Final Clinical Impression(s) / ED Diagnoses Final diagnoses:  None     @PCDICTATION @    Glendora Score, MD 07/29/21 1609    Glendora Score, MD 07/29/21 1609

## 2021-09-07 ENCOUNTER — Ambulatory Visit: Payer: Self-pay | Attending: Family Medicine | Admitting: Family Medicine

## 2021-09-07 ENCOUNTER — Other Ambulatory Visit: Payer: Self-pay

## 2021-09-27 ENCOUNTER — Other Ambulatory Visit: Payer: Self-pay

## 2021-09-27 ENCOUNTER — Other Ambulatory Visit: Payer: Self-pay | Admitting: Gerontology

## 2021-09-27 DIAGNOSIS — I10 Essential (primary) hypertension: Secondary | ICD-10-CM

## 2021-09-28 ENCOUNTER — Other Ambulatory Visit: Payer: Self-pay

## 2021-10-20 ENCOUNTER — Other Ambulatory Visit: Payer: Self-pay

## 2021-11-22 ENCOUNTER — Ambulatory Visit: Payer: Self-pay | Admitting: Family Medicine

## 2022-03-20 ENCOUNTER — Other Ambulatory Visit: Payer: Self-pay

## 2022-03-20 ENCOUNTER — Ambulatory Visit: Payer: Self-pay | Attending: Family Medicine | Admitting: Internal Medicine

## 2022-03-20 ENCOUNTER — Encounter: Payer: Self-pay | Admitting: Internal Medicine

## 2022-03-20 VITALS — BP 132/90 | HR 104 | Temp 98.1°F | Ht 73.0 in | Wt 308.6 lb

## 2022-03-20 DIAGNOSIS — Z7689 Persons encountering health services in other specified circumstances: Secondary | ICD-10-CM

## 2022-03-20 DIAGNOSIS — G4489 Other headache syndrome: Secondary | ICD-10-CM

## 2022-03-20 DIAGNOSIS — Z1211 Encounter for screening for malignant neoplasm of colon: Secondary | ICD-10-CM

## 2022-03-20 DIAGNOSIS — R051 Acute cough: Secondary | ICD-10-CM

## 2022-03-20 DIAGNOSIS — F17200 Nicotine dependence, unspecified, uncomplicated: Secondary | ICD-10-CM

## 2022-03-20 DIAGNOSIS — Z23 Encounter for immunization: Secondary | ICD-10-CM

## 2022-03-20 DIAGNOSIS — I10 Essential (primary) hypertension: Secondary | ICD-10-CM

## 2022-03-20 DIAGNOSIS — M1711 Unilateral primary osteoarthritis, right knee: Secondary | ICD-10-CM

## 2022-03-20 MED ORDER — AMLODIPINE BESYLATE 5 MG PO TABS
5.0000 mg | ORAL_TABLET | Freq: Every day | ORAL | 1 refills | Status: DC
  Filled 2022-03-20: qty 30, 30d supply, fill #0
  Filled 2022-05-30: qty 30, 30d supply, fill #1

## 2022-03-20 MED ORDER — DICLOFENAC SODIUM 1 % EX GEL
2.0000 g | Freq: Four times a day (QID) | CUTANEOUS | 1 refills | Status: DC
  Filled 2022-03-20: qty 100, 13d supply, fill #0

## 2022-03-20 MED ORDER — LISINOPRIL 5 MG PO TABS
5.0000 mg | ORAL_TABLET | Freq: Every day | ORAL | 1 refills | Status: DC
  Filled 2022-03-20: qty 30, 30d supply, fill #0

## 2022-03-20 MED ORDER — TETANUS-DIPHTH-ACELL PERTUSSIS 5-2-15.5 LF-MCG/0.5 IM SUSP
0.5000 mL | Freq: Once | INTRAMUSCULAR | 0 refills | Status: AC
  Filled 2022-03-20: qty 0.5, 1d supply, fill #0

## 2022-03-20 MED ORDER — BENZONATATE 200 MG PO CAPS
200.0000 mg | ORAL_CAPSULE | Freq: Two times a day (BID) | ORAL | 0 refills | Status: DC | PRN
  Filled 2022-03-20: qty 20, 10d supply, fill #0

## 2022-03-20 NOTE — Patient Instructions (Signed)
Please request the forms for the cone discount when you go through checkout today.  Healthy Eating Following a healthy eating pattern may help you to achieve and maintain a healthy body weight, reduce the risk of chronic disease, and live a long and productive life. It is important to follow a healthy eating pattern at an appropriate calorie level for your body. Your nutritional needs should be met primarily through food by choosing a variety of nutrient-rich foods. What are tips for following this plan? Reading food labels Read labels and choose the following: Reduced or low sodium. Juices with 100% fruit juice. Foods with low saturated fats and high polyunsaturated and monounsaturated fats. Foods with whole grains, such as whole wheat, cracked wheat, brown rice, and wild rice. Whole grains that are fortified with folic acid. This is recommended for women who are pregnant or who want to become pregnant. Read labels and avoid the following: Foods with a lot of added sugars. These include foods that contain brown sugar, corn sweetener, corn syrup, dextrose, fructose, glucose, high-fructose corn syrup, honey, invert sugar, lactose, malt syrup, maltose, molasses, raw sugar, sucrose, trehalose, or turbinado sugar. Do not eat more than the following amounts of added sugar per day: 6 teaspoons (25 g) for women. 9 teaspoons (38 g) for men. Foods that contain processed or refined starches and grains. Refined grain products, such as white flour, degermed cornmeal, white bread, and white rice. Shopping Choose nutrient-rich snacks, such as vegetables, whole fruits, and nuts. Avoid high-calorie and high-sugar snacks, such as potato chips, fruit snacks, and candy. Use oil-based dressings and spreads on foods instead of solid fats such as butter, stick margarine, or cream cheese. Limit pre-made sauces, mixes, and "instant" products such as flavored rice, instant noodles, and ready-made pasta. Try more  plant-protein sources, such as tofu, tempeh, black beans, edamame, lentils, nuts, and seeds. Explore eating plans such as the Mediterranean diet or vegetarian diet. Cooking Use oil to saut or stir-fry foods instead of solid fats such as butter, stick margarine, or lard. Try baking, boiling, grilling, or broiling instead of frying. Remove the fatty part of meats before cooking. Steam vegetables in water or broth. Meal planning  At meals, imagine dividing your plate into fourths: One-half of your plate is fruits and vegetables. One-fourth of your plate is whole grains. One-fourth of your plate is protein, especially lean meats, poultry, eggs, tofu, beans, or nuts. Include low-fat dairy as part of your daily diet. Lifestyle Choose healthy options in all settings, including home, work, school, restaurants, or stores. Prepare your food safely: Wash your hands after handling raw meats. Keep food preparation surfaces clean by regularly washing with hot, soapy water. Keep raw meats separate from ready-to-eat foods, such as fruits and vegetables. Cook seafood, meat, poultry, and eggs to the recommended internal temperature. Store foods at safe temperatures. In general: Keep cold foods at 17F (4.4C) or below. Keep hot foods at 117F (60C) or above. Keep your freezer at [facility] (-17.8C) or below. Foods are no longer safe to eat when they have been between the temperatures of 40-117F (4.4-60C) for more than 2 hours. What foods should I eat? Fruits Aim to eat 2 cup-equivalents of fresh, canned (in natural juice), or frozen fruits each day. Examples of 1 cup-equivalent of fruit include 1 small apple, 8 large strawberries, 1 cup canned fruit,  cup dried fruit, or 1 cup 100% juice. Vegetables Aim to eat 2-3 cup-equivalents of fresh and frozen vegetables each day, including different varieties  and colors. Examples of 1 cup-equivalent of vegetables include 2 medium carrots, 2 cups raw, leafy  greens, 1 cup chopped vegetable (raw or cooked), or 1 medium baked potato. Grains Aim to eat 6 ounce-equivalents of whole grains each day. Examples of 1 ounce-equivalent of grains include 1 slice of bread, 1 cup ready-to-eat cereal, 3 cups popcorn, or  cup cooked rice, pasta, or cereal. Meats and other proteins Aim to eat 5-6 ounce-equivalents of protein each day. Examples of 1 ounce-equivalent of protein include 1 egg, 1/2 cup nuts or seeds, or 1 tablespoon (16 g) peanut butter. A cut of meat or fish that is the size of a deck of cards is about 3-4 ounce-equivalents. Of the protein you eat each week, try to have at least 8 ounces come from seafood. This includes salmon, trout, herring, and anchovies. Dairy Aim to eat 3 cup-equivalents of fat-free or low-fat dairy each day. Examples of 1 cup-equivalent of dairy include 1 cup (240 mL) milk, 8 ounces (250 g) yogurt, 1 ounces (44 g) natural cheese, or 1 cup (240 mL) fortified soy milk. Fats and oils Aim for about 5 teaspoons (21 g) per day. Choose monounsaturated fats, such as canola and olive oils, avocados, peanut butter, and most nuts, or polyunsaturated fats, such as sunflower, corn, and soybean oils, walnuts, pine nuts, sesame seeds, sunflower seeds, and flaxseed. Beverages Aim for six 8-oz glasses of water per day. Limit coffee to three to five 8-oz cups per day. Limit caffeinated beverages that have added calories, such as soda and energy drinks. Limit alcohol intake to no more than 1 drink a day for nonpregnant women and 2 drinks a day for men. One drink equals 12 oz of beer (355 mL), 5 oz of wine (148 mL), or 1 oz of hard liquor (44 mL). Seasoning and other foods Avoid adding excess amounts of salt to your foods. Try flavoring foods with herbs and spices instead of salt. Avoid adding sugar to foods. Try using oil-based dressings, sauces, and spreads instead of solid fats. This information is based on general [place] nutrition guidelines. For  more information, visit choosemyplate.gov. Exact amounts may vary based on your nutrition needs. Summary A healthy eating plan may help you to maintain a healthy weight, reduce the risk of chronic diseases, and stay active throughout your life. Plan your meals. Make sure you eat the right portions of a variety of nutrient-rich foods. Try baking, boiling, grilling, or broiling instead of frying. Choose healthy options in all settings, including home, work, school, restaurants, or stores. This information is not intended to replace advice given to you by your health care provider. Make sure you discuss any questions you have with your health care provider. Document Revised: 02/01/2021 Document Reviewed: 02/01/2021 Elsevier Patient Education  2023 Elsevier Inc.  

## 2022-03-20 NOTE — Progress Notes (Signed)
Patient ID: Manuel Welch., male    DOB: August 26, 1970  MRN: [id number]  CC: Establish Care (New pt / Establish care. Out of medication, headaches. Knee pain, swollen X6 mo./Yes flu vax)   Subjective: Manuel Welch is a 51 y.o. male who presents for new patient visit His concerns today include:  Patient with history of HTN, obesity,CM with a EF of 30 to 35% on echo 2021, tobacco dependence, cocaine use,  No previous PCP in a while.  Hx of HTN Has 2 empty bottles of Norvasc 10 mg and Lisinopril 20 mg daily.  Out of meds x 8 mths. Not limiting in foods Endorses HA all over the HA about every other day.  Thinks it is due to elevated BP. No blurred vision.  Little dizziness.  No CP/SOB.  No LE edema.    C/o swelling in RT knee with sharp pains when he walks. No injury to the knee.   Thinks it may be due to wgh gain Gained 12 lbs since 04/2021.  Admits to eating 4-5 times a day, large portion of "soul foods and I lay down on it" Loves potatoes and bread. Drinks KoolAid, water, 2% milk Walks about every other day.   Tob dep: 1/3 pk/day.  Smoked since age 58.  Never quit before.  Not wanting to quit.   Complains of cough productive of yellow-brown phlegm for the past few days.  No associated nasal or chest congestion.  No fever.  Denies any drainage at the back of the throat.  No recent sick contacts.  HM:  due for flu and Tdapt shot.  Due for colon cancer screening.  No fhx of colon CA.   Patient Active Problem List   Diagnosis Date Noted   Encounter to establish care 03/11/2020   Essential hypertension 03/11/2020   Smoking 03/11/2020   Cardiomyopathy ([place])    Cocaine use    Obesity, Class III, BMI 40-49.9 (morbid obesity) ([place])    Multifocal pneumonia 12/09/2019   Atypical pneumonia 12/08/2019   Chest pain 12/08/2019   Accelerated hypertension    Elevated troponin    Tobacco abuse      Current Outpatient Medications on File Prior to Visit  Medication Sig Dispense Refill    hydrochlorothiazide (HYDRODIURIL) 25 MG tablet Take 1 tablet (25 mg total) by mouth once daily in the morning. 30 tablet 0   No current facility-administered medications on file prior to visit.    Allergies  Allergen Reactions   Bee Venom     Social History   Socioeconomic History   Marital status: Single    Spouse name: Not on file   Number of children: Not on file   Years of education: Not on file   Highest education level: Not on file  Occupational History   Not on file  Tobacco Use   Smoking status: Every Day    Packs/day: 0.25    Years: 29.00    Total pack years: 7.25    Types: Cigarettes   Smokeless tobacco: Never  Vaping Use   Vaping Use: Never used  Substance and Sexual Activity   Alcohol use: Yes   Drug use: No   Sexual activity: Yes  Other Topics Concern   Not on file  Social History Narrative   Lives at home with mother.   Social Determinants of Health   Financial Resource Strain: Not on file  Food Insecurity: No Food Insecurity (05/05/2021)   Hunger Vital Sign  Worried About Charity fundraiser in the Last Year: Never true    [place] in the Last Year: Never true  Transportation Needs: No Transportation Needs (05/05/2021)   PRAPARE - Hydrologist (Medical): No    Lack of Transportation (Non-Medical): No  Physical Activity: Not on file  Stress: Not on file  Social Connections: Not on file  Intimate Partner Violence: Not on file    Family History  Problem Relation Age of Onset   Hypertension Mother    Hypertension Father    Hypertension Brother     Past Surgical History:  Procedure Laterality Date   NO PAST SURGERIES      ROS: Review of Systems Negative except as stated above  PHYSICAL EXAM: BP (!) 132/90 (BP Location: Left Arm, Patient Position: Sitting, Cuff Size: Large)   Pulse (!) 104   Temp 98.1 F (36.7 C) (Oral)   Ht 6\' 1"  (1.854 m)   Wt (!) 308 lb 9.6 oz (140 kg)   SpO2 98%   BMI  40.71 kg/m   Wt Readings from Last 3 Encounters:  03/20/22 (!) 308 lb 9.6 oz (140 kg)  07/29/21 293 lb (132.9 kg)  05/05/21 296 lb (134.3 kg)    Physical Exam  General appearance - alert, well appearing, middle-age obese African-American male and in no distress Mental status - normal mood, behavior, speech, dress, motor activity, and thought processes Eyes - pupils equal and reactive, extraocular eye movements intact Neck - supple, no significant adenopathy Chest - clear to auscultation, no wheezes, rales or rhonchi, symmetric air entry Heart - normal rate, regular rhythm, normal S1, S2, no murmurs, rubs, clicks or gallops Musculoskeletal -right knee: Mild edema.  Mild tenderness on palpation of the medial joint line.  Moderate crepitus with passive range of motion. Extremities -no edema in the lower legs or ankles. Neuro: Cranial nerves grossly intact.  Power 5/5 in all 4 extremities proximally and distally.  Gross sensation intact.  Gait is normal.     Latest Ref Rng & Units 12/02/2020    6:43 PM 12/10/2019    5:01 AM 12/09/2019    5:27 AM  CMP  Glucose 65 - 99 mg/dL 96  112  112   BUN 6 - 24 mg/dL 15  15  14    Creatinine 0.76 - 1.27 mg/dL 1.17  1.09  1.07   Sodium 134 - 144 mmol/L 141  142  141   Potassium 3.5 - 5.2 mmol/L 4.0  3.8  4.0   Chloride 96 - 106 mmol/L 105  109  108   CO2 20 - 29 mmol/L 20  25  24    Calcium 8.7 - 10.2 mg/dL 9.8  8.9  8.9   Total Protein 6.0 - 8.5 g/dL 7.1     Total Bilirubin 0.0 - 1.2 mg/dL 0.4     Alkaline Phos 44 - 121 IU/L 104     AST 0 - 40 IU/L 16     ALT 0 - 44 IU/L 17      Lipid Panel     Component Value Date/Time   CHOL 173 12/02/2020 1843   TRIG 124 12/02/2020 1843   HDL 44 12/02/2020 1843   CHOLHDL 3.9 12/02/2020 1843   CHOLHDL 5.2 12/09/2019 0527   VLDL 18 12/09/2019 0527   LDLCALC 107 (H) 12/02/2020 1843    CBC    Component Value Date/Time   WBC 6.9 12/10/2019 0501   RBC  4.30 12/10/2019 0501   HGB 13.6 12/10/2019 0501    HCT 39.4 12/10/2019 0501   PLT 358 12/10/2019 0501   MCV 91.6 12/10/2019 0501   MCH 31.6 12/10/2019 0501   MCHC 34.5 12/10/2019 0501   RDW 13.5 12/10/2019 0501   LYMPHSABS 1.8 12/10/2019 0501   MONOABS 0.4 12/10/2019 0501   EOSABS 0.5 12/10/2019 0501   BASOSABS 0.1 12/10/2019 0501    ASSESSMENT AND PLAN: 1. Establishing care with new doctor, encounter for   2. Essential hypertension Not at goal.  Patient has been off medications for over 6 months.  We will restart him on Norvasc 5 mg daily and lisinopril 5 mg daily.  DASH diet discussed and encouraged. - CBC - Comprehensive metabolic panel - amLODipine (NORVASC) 5 MG tablet; Take 1 tablet (5 mg total) by mouth daily.  Dispense: 90 tablet; Refill: 1 - lisinopril (ZESTRIL) 5 MG tablet; Take 1 tablet (5 mg total) by mouth daily.  Dispense: 90 tablet; Refill: 1  3. Headache syndrome We will see whether this improves with better control of blood pressure.  4. Tobacco dependence Advised to quit.  Discussed health risks associated with smoking.  Patient not ready to give a trial of quitting.  5. Primary osteoarthritis of right knee Most likely osteoarthritis. Discussed the importance of weight loss. Trial of Voltaren gel. - diclofenac Sodium (VOLTAREN) 1 % GEL; Apply 2 g topically 4 (four) times daily.  Dispense: 100 g; Refill: 1  6. Obesity, Class III, BMI 40-49.9 (morbid obesity) ([place]) Patient advised to eliminate sugary drinks from the diet, cut back on portion sizes especially of white carbohydrates, eat more white lean meat like chicken [place] and seafood instead of beef or pork and incorporate fresh fruits and vegetables into the diet daily.  - Lipid panel - Hemoglobin A1c  7. Acute cough - benzonatate (TESSALON) 200 MG capsule; Take 1 capsule (200 mg total) by mouth 2 (two) times daily as needed for cough.  Dispense: 20 capsule; Refill: 0  8. Need for Tdap vaccination Given prescription to take to our pharmacy to be given  the shot.  9. Screening for colon cancer Discussed colon cancer screening.  He is agreeable to doing the fit test. - Fecal occult blood, imunochemical([facility])  10. Need for immunization against influenza Given today. - Flu Vaccine QUAD 96mo+IM (Fluarix, Fluzone & Alfiuria Quad PF)    Patient was given the opportunity to ask questions.  Patient verbalized understanding of the plan and was able to repeat key elements of the plan.   This documentation was completed using Radio producer.  Any transcriptional errors are unintentional.  Orders Placed This Encounter  Procedures   Fecal occult blood, imunochemical([facility])   Flu Vaccine QUAD 37mo+IM (Fluarix, Fluzone & Alfiuria Quad PF)   CBC   Comprehensive metabolic panel   Lipid panel   Hemoglobin A1c     Requested Prescriptions   Signed Prescriptions Disp Refills   amLODipine (NORVASC) 5 MG tablet 90 tablet 1    Sig: Take 1 tablet (5 mg total) by mouth daily.   lisinopril (ZESTRIL) 5 MG tablet 90 tablet 1    Sig: Take 1 tablet (5 mg total) by mouth daily.   benzonatate (TESSALON) 200 MG capsule 20 capsule 0    Sig: Take 1 capsule (200 mg total) by mouth 2 (two) times daily as needed for cough.   diclofenac Sodium (VOLTAREN) 1 % GEL 100 g 1    Sig: Apply 2 g topically  4 (four) times daily.   Tdap (ADACEL) 10-18-13.5 LF-MCG/0.5 injection 0.5 mL 0    Sig: Inject 0.5 mLs into the muscle once for 1 dose.    Return in about 3 months (around 06/20/2022) for Appt with [facility] in 4 wks for BP check.  Karle Plumber, MD, FACP

## 2022-03-21 ENCOUNTER — Other Ambulatory Visit: Payer: Self-pay

## 2022-03-21 LAB — COMPREHENSIVE METABOLIC PANEL
ALT: 12 IU/L (ref 0–44)
AST: 13 IU/L (ref 0–40)
Albumin/Globulin Ratio: 1.9 (ref 1.2–2.2)
Albumin: 4.7 g/dL (ref 4.1–5.1)
Alkaline Phosphatase: 111 IU/L (ref 44–121)
BUN/Creatinine Ratio: 10 (ref 9–20)
BUN: 10 mg/dL (ref 6–24)
Bilirubin Total: 0.5 mg/dL (ref 0.0–1.2)
CO2: 21 mmol/L (ref 20–29)
Calcium: 10.1 mg/dL (ref 8.7–10.2)
Chloride: 104 mmol/L (ref 96–106)
Creatinine, Ser: 0.97 mg/dL (ref 0.76–1.27)
Globulin, Total: 2.5 g/dL (ref 1.5–4.5)
Glucose: 97 mg/dL (ref 70–99)
Potassium: 4.5 mmol/L (ref 3.5–5.2)
Sodium: 143 mmol/L (ref 134–144)
Total Protein: 7.2 g/dL (ref 6.0–8.5)
eGFR: 96 mL/min/{1.73_m2} (ref >59)

## 2022-03-21 LAB — LIPID PANEL
Chol/HDL Ratio: 4.9 ratio (ref 0.0–5.0)
Cholesterol, Total: 202 mg/dL — ABNORMAL HIGH (ref 100–199)
HDL: 41 mg/dL (ref >39)
LDL Chol Calc (NIH): 134 mg/dL — ABNORMAL HIGH (ref 0–99)
Triglycerides: 150 mg/dL — ABNORMAL HIGH (ref 0–149)
VLDL Cholesterol Cal: 27 mg/dL (ref 5–40)

## 2022-03-21 LAB — CBC
Hematocrit: 46.9 % (ref 37.5–51.0)
Hemoglobin: 15.6 g/dL (ref 13.0–17.7)
MCH: 31 pg (ref 26.6–33.0)
MCHC: 33.3 g/dL (ref 31.5–35.7)
MCV: 93 fL (ref 79–97)
Platelets: 460 10*3/uL — ABNORMAL HIGH (ref 150–450)
RBC: 5.04 x10E6/uL (ref 4.14–5.80)
RDW: 12 % (ref 11.6–15.4)
WBC: 7.9 10*3/uL (ref 3.4–10.8)

## 2022-03-21 LAB — HEMOGLOBIN A1C
Est. average glucose Bld gHb Est-mCnc: 108 mg/dL
Hgb A1c MFr Bld: 5.4 % (ref 4.8–5.6)

## 2022-03-22 ENCOUNTER — Other Ambulatory Visit: Payer: Self-pay | Admitting: Internal Medicine

## 2022-03-22 ENCOUNTER — Other Ambulatory Visit: Payer: Self-pay

## 2022-03-22 MED ORDER — ATORVASTATIN CALCIUM 10 MG PO TABS
10.0000 mg | ORAL_TABLET | Freq: Every day | ORAL | 1 refills | Status: DC
  Filled 2022-03-22: qty 30, 30d supply, fill #0

## 2022-03-23 ENCOUNTER — Other Ambulatory Visit: Payer: Self-pay

## 2022-03-31 ENCOUNTER — Emergency Department (HOSPITAL_COMMUNITY)
Admission: EM | Admit: 2022-03-31 | Discharge: 2022-04-01 | Disposition: A | Discharge status: Home / Self Care | Payer: Self-pay | Attending: Emergency Medicine | Admitting: Emergency Medicine

## 2022-03-31 ENCOUNTER — Other Ambulatory Visit: Payer: Self-pay

## 2022-03-31 ENCOUNTER — Emergency Department (HOSPITAL_COMMUNITY): Payer: Self-pay

## 2022-03-31 ENCOUNTER — Encounter (HOSPITAL_COMMUNITY): Payer: Self-pay

## 2022-03-31 DIAGNOSIS — R0602 Shortness of breath: Secondary | ICD-10-CM | POA: Insufficient documentation

## 2022-03-31 DIAGNOSIS — R079 Chest pain, unspecified: Secondary | ICD-10-CM

## 2022-03-31 DIAGNOSIS — R0789 Other chest pain: Secondary | ICD-10-CM | POA: Insufficient documentation

## 2022-03-31 DIAGNOSIS — I10 Essential (primary) hypertension: Secondary | ICD-10-CM | POA: Insufficient documentation

## 2022-03-31 DIAGNOSIS — Z79899 Other long term (current) drug therapy: Secondary | ICD-10-CM | POA: Insufficient documentation

## 2022-03-31 DIAGNOSIS — Z20822 Contact with and (suspected) exposure to covid-19: Secondary | ICD-10-CM | POA: Insufficient documentation

## 2022-03-31 LAB — CBC WITH DIFFERENTIAL/PLATELET
Abs Immature Granulocytes: 0.04 10*3/uL (ref 0.00–0.07)
Basophils Absolute: 0.1 10*3/uL (ref 0.0–0.1)
Basophils Relative: 1 %
Eosinophils Absolute: 0.4 10*3/uL (ref 0.0–0.5)
Eosinophils Relative: 5 %
HCT: 45.4 % (ref 39.0–52.0)
Hemoglobin: 14.7 g/dL (ref 13.0–17.0)
Immature Granulocytes: 0 %
Lymphocytes Relative: 25 %
Lymphs Abs: 2.3 10*3/uL (ref 0.7–4.0)
MCH: 31.2 pg (ref 26.0–34.0)
MCHC: 32.4 g/dL (ref 30.0–36.0)
MCV: 96.4 fL (ref 80.0–100.0)
Monocytes Absolute: 0.8 10*3/uL (ref 0.1–1.0)
Monocytes Relative: 9 %
Neutro Abs: 5.7 10*3/uL (ref 1.7–7.7)
Neutrophils Relative %: 60 %
Platelets: 414 10*3/uL — ABNORMAL HIGH (ref 150–400)
RBC: 4.71 MIL/uL (ref 4.22–5.81)
RDW: 12 % (ref 11.5–15.5)
WBC: 9.4 10*3/uL (ref 4.0–10.5)
nRBC: 0 % (ref 0.0–0.2)

## 2022-03-31 LAB — COMPREHENSIVE METABOLIC PANEL
ALT: 16 U/L (ref 0–44)
AST: 17 U/L (ref 15–41)
Albumin: 4 g/dL (ref 3.5–5.0)
Alkaline Phosphatase: 83 U/L (ref 38–126)
Anion gap: 14 (ref 5–15)
BUN: 15 mg/dL (ref 6–20)
CO2: 18 mmol/L — ABNORMAL LOW (ref 22–32)
Calcium: 9.5 mg/dL (ref 8.9–10.3)
Chloride: 109 mmol/L (ref 98–111)
Creatinine, Ser: 1.22 mg/dL (ref 0.61–1.24)
GFR, Estimated: >60 mL/min (ref >60)
Glucose, Bld: 111 mg/dL — ABNORMAL HIGH (ref 70–99)
Potassium: 4.2 mmol/L (ref 3.5–5.1)
Sodium: 141 mmol/L (ref 135–145)
Total Bilirubin: 0.3 mg/dL (ref 0.3–1.2)
Total Protein: 7.1 g/dL (ref 6.5–8.1)

## 2022-03-31 LAB — MAGNESIUM: Magnesium: 2 mg/dL (ref 1.7–2.4)

## 2022-03-31 LAB — BRAIN NATRIURETIC PEPTIDE: B Natriuretic Peptide: 20.9 pg/mL (ref 0.0–100.0)

## 2022-03-31 LAB — TROPONIN I (HIGH SENSITIVITY): Troponin I (High Sensitivity): 9 ng/L (ref <18)

## 2022-03-31 MED ORDER — NITROGLYCERIN 0.4 MG SL SUBL: 0.4000 mg | SUBLINGUAL_TABLET | SUBLINGUAL | Status: DC | PRN

## 2022-03-31 MED ORDER — ASPIRIN 325 MG PO TBEC: 325.0000 mg | DELAYED_RELEASE_TABLET | Freq: Every day | ORAL | Status: DC

## 2022-03-31 MED ORDER — ALBUTEROL SULFATE (2.5 MG/3ML) 0.083% IN NEBU
2.5000 mg | INHALATION_SOLUTION | Freq: Once | RESPIRATORY_TRACT | Status: AC
  Administered 2022-03-31: 2.5 mg via RESPIRATORY_TRACT
  Filled 2022-03-31: qty 3

## 2022-03-31 NOTE — ED Triage Notes (Signed)
Patient arrives via EMS from home due to SOB after mixing bleach and pine sol while cleaning today. Per EMS patient was 91% on room air so they placed him on NRB prior to arrival and spo2 increased to 98%. Patient complaining of 8/10 chest pain and SOB.

## 2022-03-31 NOTE — ED Provider Notes (Signed)
  [facility] EMERGENCY DEPARTMENT Provider Note   CSN: [id number] Arrival date & time: 03/31/22  2158     History {Add pertinent medical, surgical, social history, OB history to HPI:1} Chief Complaint  Patient presents with   Shortness of Breath    Manuel Hellard. is a 51 y.o. male.   Shortness of Breath      Home Medications Prior to Admission medications   Medication Sig Start Date End Date Taking? Authorizing Provider  amLODipine (NORVASC) 5 MG tablet Take 1 tablet (5 mg total) by mouth daily. 03/20/22   Ladell Pier, MD  atorvastatin (LIPITOR) 10 MG tablet Take 1 tablet (10 mg total) by mouth daily. 03/22/22   Ladell Pier, MD  benzonatate (TESSALON) 200 MG capsule Take 1 capsule (200 mg total) by mouth 2 (two) times daily as needed for cough. 03/20/22   Ladell Pier, MD  diclofenac Sodium (VOLTAREN) 1 % GEL Apply 2 g topically 4 (four) times daily. 03/20/22   Ladell Pier, MD  hydrochlorothiazide (HYDRODIURIL) 25 MG tablet Take 1 tablet (25 mg total) by mouth once daily in the morning. 02/19/21 03/21/21  Lynden Oxford Scales, PA-C  lisinopril (ZESTRIL) 5 MG tablet Take 1 tablet (5 mg total) by mouth daily. 03/20/22   Ladell Pier, MD      Allergies    Bee venom    Review of Systems   Review of Systems  Respiratory:  Positive for shortness of breath.     Physical Exam Updated Vital Signs There were no vitals taken for this visit. Physical Exam  ED Results / Procedures / Treatments   Labs (all labs ordered are listed, but only abnormal results are displayed) Labs Reviewed - No data to display  EKG None  Radiology No results found.  Procedures Procedures  {Document cardiac monitor, telemetry assessment procedure when appropriate:1}  Medications Ordered in ED Medications - No data to display  ED Course/ Medical Decision Making/ A&P                           Medical Decision Making  ***  {Document  critical care time when appropriate:1} {Document review of labs and clinical decision tools ie heart score, Chads2Vasc2 etc:1}  {Document your independent review of radiology images, and any outside records:1} {Document your discussion with family members, caretakers, and with consultants:1} {Document social determinants of health affecting pt's care:1} {Document your decision making why or why not admission, treatments were needed:1} Final Clinical Impression(s) / ED Diagnoses Final diagnoses:  None    Rx / DC Orders ED Discharge Orders     None

## 2022-04-01 LAB — SARS CORONAVIRUS 2 BY RT PCR: SARS Coronavirus 2 by RT PCR: NEGATIVE

## 2022-04-01 LAB — TROPONIN I (HIGH SENSITIVITY): Troponin I (High Sensitivity): 9 ng/L (ref <18)

## 2022-04-01 NOTE — Discharge Instructions (Signed)
Your cadiac work-up today was normal.  Covid test was also negative. We recommend that you follow-up with your primary care doctor. Return here for new concerns.

## 2022-04-01 NOTE — ED Notes (Signed)
Pt verbalized understanding of d/c instructions, meds, and followup care. Denies questions. VSS, no distress noted. Steady gait to exit with all belongings.  ?

## 2022-04-01 NOTE — ED Provider Notes (Signed)
  Delta trop negative.  Covid screen also negative.  VS remain stable.  Appears appropriate for discharge with OP follow-up as per prior team's plan.  Can return here for new concerns.   Larene Pickett, PA-C 04/01/22 0602    Mesner, Corene Cornea, MD 04/01/22 [phone]

## 2022-04-24 ENCOUNTER — Other Ambulatory Visit: Payer: Self-pay

## 2022-04-24 ENCOUNTER — Ambulatory Visit: Payer: Self-pay | Attending: Internal Medicine | Admitting: Pharmacist

## 2022-04-24 VITALS — BP 144/85 | HR 106 | Wt 311.4 lb

## 2022-04-24 DIAGNOSIS — I10 Essential (primary) hypertension: Secondary | ICD-10-CM

## 2022-04-24 DIAGNOSIS — R051 Acute cough: Secondary | ICD-10-CM

## 2022-04-24 MED ORDER — BENZONATATE 200 MG PO CAPS
200.0000 mg | ORAL_CAPSULE | Freq: Two times a day (BID) | ORAL | 0 refills | Status: DC | PRN
  Filled 2022-04-24: qty 20, 10d supply, fill #0

## 2022-04-24 MED ORDER — ATORVASTATIN CALCIUM 10 MG PO TABS
10.0000 mg | ORAL_TABLET | Freq: Every day | ORAL | 1 refills | Status: DC
  Filled 2022-04-24: qty 30, 30d supply, fill #0
  Filled 2022-05-30: qty 30, 30d supply, fill #1

## 2022-04-24 MED ORDER — VALSARTAN 160 MG PO TABS
160.0000 mg | ORAL_TABLET | Freq: Every day | ORAL | 3 refills | Status: DC
  Filled 2022-04-24: qty 30, 30d supply, fill #0
  Filled 2022-05-30: qty 30, 30d supply, fill #1

## 2022-04-24 NOTE — Progress Notes (Signed)
S:    No chief complaint on file.  51 y.o. male who presents for hypertension evaluation, education, and management.  PMH is significant for HTN, obesity, CM with a EF of 30 to 35% on echo 2021, and tobacco dependence.  Patient was referred and last seen by Primary Care Provider, Dr. Wynetta Emery, on 03/20/2022.   At last visit, patient was out of his medications for ~8 months. Amlodipine 5 mg and lisinopril 5 mg were restarted. Of note, patient presented to the ED for chest pain on 03/31/2022. Troponins were flat, BNP was WNL, VS were stable, and patient was discharged home.  Today, patient arrives in spirits and presents without assistance. Denies dizziness, headache, blurred vision, swelling. He does endorse a bothersome cough today.  Patient reports hypertension was diagnosed 'a long time ago'.   Medication adherence reported. Patient has taken BP medications today.  Last smoked cigarette at 7 AM and he is not interested in quitting smoking.    Current antihypertensives include: lisinopril 5 mg daily, amlodipine 5 mg daily  Antihypertensives tried in the past include: HCTZ (on his profile but he is unsure why/when this medication was started or stopped)  Reported home BP readings: not checking   O:  Vitals:   04/24/22 0915  BP: (!) 144/85  Pulse: (!) 106    Last 3 Office BP readings: BP Readings from Last 3 Encounters:  04/24/22 (!) 144/85  04/01/22 (!) 126/90  03/20/22 (!) 132/90    BMET    Component Value Date/Time   NA 141 03/31/2022 2209   NA 143 03/20/2022 1012   K 4.2 03/31/2022 2209   CL 109 03/31/2022 2209   CO2 18 (L) 03/31/2022 2209   GLUCOSE 111 (H) 03/31/2022 2209   BUN 15 03/31/2022 2209   BUN 10 03/20/2022 1012   CREATININE 1.22 03/31/2022 2209   CALCIUM 9.5 03/31/2022 2209   GFRNONAA >60 03/31/2022 2209   GFRAA >60 12/10/2019 0501    Renal function: CrCl cannot be calculated (Patient's most recent lab result is older than the maximum 21 days  allowed.).  Clinical ASCVD: Yes  The 10-year ASCVD risk score (Arnett DK, et al., 2019) is: 18.3%   Values used to calculate the score:     Age: 60 years     Sex: Male     Is Non-Hispanic African American: Yes     Diabetic: No     Tobacco smoker: Yes     Systolic Blood Pressure: 401 mmHg     Is BP treated: Yes     HDL Cholesterol: 41 mg/dL     Total Cholesterol: 202 mg/dL   A/P: Hypertension diagnosed currently above goal on current medications. BP goal < 130/80 mmHg. Medication adherence appears appropriate. Control is suboptimal due to needing therapy intensification and concurrent tobacco abuse.  -Discontinued lisinopril 5 mg. Could also be contributing to/worsening cough. -Started valsartan 160 mg daily.  BMET at next visit.  -Recommended smoking cessation; however, patient is not interested in quitting smoking.  -Patient educated on purpose, proper use, and potential adverse effects of valsartan. -Counseled on lifestyle modifications for blood pressure control including reduced dietary sodium, increased exercise, adequate sleep. -Encouraged patient to check BP at home and bring log of readings to next visit. Counseled on proper use of home BP cuff.   Written patient instructions provided. Patient verbalized understanding of treatment plan.  Total time in face to face counseling 25 minutes.    Follow-up:  Pharmacist 1 month. PCP  clinic visit in on 06/22/2022.  Valeda Malm, Pharm.D. PGY-2 Ambulatory Care Pharmacy Resident 04/24/2022 11:32 AM

## 2022-05-30 ENCOUNTER — Other Ambulatory Visit: Payer: Self-pay

## 2022-05-30 ENCOUNTER — Ambulatory Visit: Payer: Self-pay | Attending: Internal Medicine | Admitting: Pharmacist

## 2022-05-30 ENCOUNTER — Other Ambulatory Visit: Payer: Self-pay | Admitting: Internal Medicine

## 2022-05-30 VITALS — BP 137/90 | Wt 304.0 lb

## 2022-05-30 DIAGNOSIS — I10 Essential (primary) hypertension: Secondary | ICD-10-CM

## 2022-05-30 DIAGNOSIS — R051 Acute cough: Secondary | ICD-10-CM

## 2022-05-30 MED ORDER — BENZONATATE 200 MG PO CAPS
200.0000 mg | ORAL_CAPSULE | Freq: Two times a day (BID) | ORAL | 0 refills | Status: DC | PRN
  Filled 2022-05-30: qty 20, 10d supply, fill #0

## 2022-05-30 NOTE — Progress Notes (Signed)
   S:    No chief complaint on file.  51 y.o. male who presents for hypertension evaluation, education, and management.  PMH is significant for HTN, obesity, CM with a EF of 30 to 35% on echo 2021, and tobacco dependence. Patient was referred and last seen by Primary Care Provider, Dr. Wynetta Emery, on 03/20/2022. We saw him on 04/24/2022 and changed lisinopril to valsartan.   Today, patient arrives in spirits and presents without assistance. Denies dizziness, headache, blurred vision, swelling. He does endorse continued RT knee pain. Tells this hurts more when he moves his knee. Mostly with extension. Voltaren gel has been ineffective. He wonders if he can try something PO for pain control.   Medication adherence denied. Ran out of medications last week and has not refilled.   Current antihypertensives include: valsartan 160 mg daily, amlodipine 5 mg daily  Antihypertensives tried in the past include: HCTZ (on his profile but he is unsure why/when this medication was started or stopped), lisinopril (dry cough)  Reported home BP readings: not checking   O:  Vitals:   05/30/22 0910  BP: (!) 137/90    Last 3 Office BP readings: BP Readings from Last 3 Encounters:  05/30/22 (!) 137/90  04/24/22 (!) 144/85  04/01/22 (!) 126/90    BMET    Component Value Date/Time   NA 141 03/31/2022 2209   NA 143 03/20/2022 1012   K 4.2 03/31/2022 2209   CL 109 03/31/2022 2209   CO2 18 (L) 03/31/2022 2209   GLUCOSE 111 (H) 03/31/2022 2209   BUN 15 03/31/2022 2209   BUN 10 03/20/2022 1012   CREATININE 1.22 03/31/2022 2209   CALCIUM 9.5 03/31/2022 2209   GFRNONAA >60 03/31/2022 2209   GFRAA >60 12/10/2019 0501    Renal function: CrCl cannot be calculated (Patient's most recent lab result is older than the maximum 21 days allowed.).  Clinical ASCVD: Yes  The 10-year ASCVD risk score (Arnett DK, et al., 2019) is: 16.8%   Values used to calculate the score:     Age: 70 years     Sex: Male      Is Non-Hispanic African American: Yes     Diabetic: No     Tobacco smoker: Yes     Systolic Blood Pressure: 758 mmHg     Is BP treated: Yes     HDL Cholesterol: 41 mg/dL     Total Cholesterol: 202 mg/dL   A/P: Hypertension diagnosed currently above goal on current medications. BP goal < 130/80 mmHg. Medication adherence is suboptimal at this time.  -Continue valsartan and amlodipine at current doses. Refills sent to our pharmacy. He will pick these up after his appt today.  -BMP8+eGFR -Counseled on lifestyle modifications for blood pressure control including reduced dietary sodium, increased exercise, adequate sleep. -Encouraged patient to check BP at home and bring log of readings to next visit. Counseled on proper use of home BP cuff.  -Encouraged patient to pick-up a new CAFA app and schedule an appt with JT.   Written patient instructions provided. Patient verbalized understanding of treatment plan.  Total time in face to face counseling 25 minutes.    Follow-up:  Pharmacist prn. PCP clinic visit in on 06/22/2022.  Manuel Welch, PharmD, Manuel Welch, Manuel Welch [phone]

## 2022-05-31 LAB — BMP8+EGFR
BUN/Creatinine Ratio: 11 (ref 9–20)
BUN: 12 mg/dL (ref 6–24)
CO2: 20 mmol/L (ref 20–29)
Calcium: 10 mg/dL (ref 8.7–10.2)
Chloride: 106 mmol/L (ref 96–106)
Creatinine, Ser: 1.05 mg/dL (ref 0.76–1.27)
Glucose: 97 mg/dL (ref 70–99)
Potassium: 4.5 mmol/L (ref 3.5–5.2)
Sodium: 142 mmol/L (ref 134–144)
eGFR: 87 mL/min/{1.73_m2} (ref >59)

## 2022-06-22 ENCOUNTER — Other Ambulatory Visit: Payer: Self-pay

## 2022-06-22 ENCOUNTER — Ambulatory Visit: Payer: BLUE CROSS/BLUE SHIELD | Attending: Internal Medicine | Admitting: Internal Medicine

## 2022-06-22 ENCOUNTER — Encounter: Payer: Self-pay | Admitting: Internal Medicine

## 2022-06-22 VITALS — BP 146/94 | HR 110 | Temp 98.4°F | Ht 73.0 in | Wt 304.0 lb

## 2022-06-22 DIAGNOSIS — Z1211 Encounter for screening for malignant neoplasm of colon: Secondary | ICD-10-CM

## 2022-06-22 DIAGNOSIS — I429 Cardiomyopathy, unspecified: Secondary | ICD-10-CM

## 2022-06-22 DIAGNOSIS — E782 Mixed hyperlipidemia: Secondary | ICD-10-CM | POA: Insufficient documentation

## 2022-06-22 DIAGNOSIS — M1711 Unilateral primary osteoarthritis, right knee: Secondary | ICD-10-CM

## 2022-06-22 DIAGNOSIS — F17200 Nicotine dependence, unspecified, uncomplicated: Secondary | ICD-10-CM

## 2022-06-22 DIAGNOSIS — F17210 Nicotine dependence, cigarettes, uncomplicated: Secondary | ICD-10-CM | POA: Diagnosis not present

## 2022-06-22 DIAGNOSIS — Z6841 Body Mass Index (BMI) 40.0 and over, adult: Secondary | ICD-10-CM

## 2022-06-22 DIAGNOSIS — I10 Essential (primary) hypertension: Secondary | ICD-10-CM | POA: Diagnosis not present

## 2022-06-22 MED ORDER — ATORVASTATIN CALCIUM 10 MG PO TABS
10.0000 mg | ORAL_TABLET | Freq: Every day | ORAL | 6 refills | Status: DC
  Filled 2022-06-22 – 2022-07-17 (×2): qty 30, 30d supply, fill #0
  Filled 2022-08-21: qty 30, 30d supply, fill #1
  Filled 2022-09-28: qty 30, 30d supply, fill #2
  Filled 2022-11-08: qty 30, 30d supply, fill #3
  Filled 2022-12-06 (×2): qty 30, 30d supply, fill #4
  Filled 2023-01-03: qty 30, 30d supply, fill #5
  Filled 2023-01-30: qty 30, 30d supply, fill #6

## 2022-06-22 MED ORDER — CELECOXIB 200 MG PO CAPS
200.0000 mg | ORAL_CAPSULE | Freq: Every day | ORAL | 1 refills | Status: DC
  Filled 2022-06-22: qty 30, 30d supply, fill #0

## 2022-06-22 MED ORDER — AMLODIPINE BESYLATE 10 MG PO TABS
10.0000 mg | ORAL_TABLET | Freq: Every day | ORAL | 6 refills | Status: DC
  Filled 2022-06-22: qty 30, 30d supply, fill #0
  Filled 2022-07-17: qty 30, 30d supply, fill #1
  Filled 2022-08-21: qty 30, 30d supply, fill #2
  Filled 2022-09-28: qty 30, 30d supply, fill #3
  Filled 2022-11-08: qty 30, 30d supply, fill #4
  Filled 2022-12-06 (×2): qty 30, 30d supply, fill #5
  Filled 2023-01-03: qty 30, 30d supply, fill #6

## 2022-06-22 MED ORDER — VALSARTAN 160 MG PO TABS
160.0000 mg | ORAL_TABLET | Freq: Every day | ORAL | 6 refills | Status: DC
  Filled 2022-06-22 – 2022-07-17 (×2): qty 30, 30d supply, fill #0
  Filled 2022-08-21: qty 30, 30d supply, fill #1
  Filled 2022-09-28: qty 30, 30d supply, fill #2
  Filled 2022-11-08: qty 30, 30d supply, fill #3
  Filled 2022-12-06 (×2): qty 30, 30d supply, fill #4
  Filled 2023-01-03: qty 30, 30d supply, fill #5
  Filled 2023-01-30: qty 30, 30d supply, fill #6

## 2022-06-22 NOTE — Patient Instructions (Addendum)
Your blood pressure is not at goal.  We have increased amlodipine to 10 mg daily.  Continue the valsartan. We have referred you for study on your heart: Echocardiogram to evaluate the heart function.  Please remember to stop at [facility] imaging on the first floor today to have x-ray done of your right knee.

## 2022-06-22 NOTE — Progress Notes (Signed)
Patient ID: Manuel Space., male    DOB: [birth year]  MRN: [id number]  CC: Hypertension (HTN f/u. Med refill/R knee pain X2 yrs)   Subjective: Manuel Welch is a 52 y.o. male who presents for chronic ds management His concerns today include:  Patient with history of HTN, obesity,CM with a EF of 30 to 35% on echo 2021, tob dep, cocaine use, OA knee  Did not bring meds with him today.  Needs RF.  HYPERTENSION Currently taking: see medication list.  Saw clinical pharmacist since last visit.  Lisinopril changed to Diovan 160 mg daily.  Continued with Norvasc 5 mg Med Adherence: [x]  Yes    []  No Medication side effects: []  Yes    [x]  No Adherence with salt restriction: [x]  Yes    []  No Home Monitoring?: []  Yes    []  No Monitoring Frequency:  Home BP results range:  SOB? []  Yes    [x]  No Chest Pain?: []  Yes    [x]  No Leg swelling?: []  Yes    [x]  No Headaches?: []  Yes    [x]  No Dizziness? []  Yes    [x]  No Comments: Reports compliance with taking Lipitor for cholesterol.  LDL was 134 with ASCVD of .  Continues to have pain in RT knee.  "Feels like bone on bone."  Hears a popping sound when he walks. Some swelling.  Diclofenac gel did not help. Down 4 lbs since last visit.  Tells me that he likes white bread.  Drinks , chocolate and white milk  Tob dep:  not ready to quit  Pt hosp 11/2019 with pneumonia.  Echo revealed moderate MR with EF 30-35%.  He has not had a repeat echo since then.  Sleeps on 1 pillow.  No PND or orthopnea  HM: Fit test ordered on last visit.  Patient states he never received it.  He prefers to do the colonoscopy. Recently got Medicaid.  Has form with him for the orange card/cone discount card.  Wonders whether he still needs to turn it then or not. Patient Active Problem List   Diagnosis Date Noted   Mixed hyperlipidemia 06/22/2022   Primary osteoarthritis of right knee 03/20/2022   Encounter to establish care 03/11/2020   Essential hypertension  03/11/2020   Tobacco dependence 03/11/2020   Cardiomyopathy (HCC)    Cocaine use    Obesity, Class III, BMI 40-49.9 (morbid obesity) (HCC)    Multifocal pneumonia 12/09/2019   Atypical pneumonia 12/08/2019   Chest pain 12/08/2019   Accelerated hypertension    Elevated troponin    Tobacco abuse      No current outpatient medications on file prior to visit.   No current facility-administered medications on file prior to visit.    Allergies  Allergen Reactions   Bee Venom     Social History   Socioeconomic History   Marital status: Single    Spouse name: Not on file   Number of children: Not on file   Years of education: Not on file   Highest education level: Not on file  Occupational History   Not on file  Tobacco Use   Smoking status: Every Day    Packs/day: 0.25    Years: 29.00    Total pack years: 7.25    Types: Cigarettes   Smokeless tobacco: Never  Vaping Use   Vaping Use: Never used  Substance and Sexual Activity   Alcohol use: Yes   Drug use: No   Sexual  activity: Yes  Other Topics Concern   Not on file  Social History Narrative   Lives at home with mother.   Social Determinants of Health   Financial Resource Strain: Not on file  Food Insecurity: No Food Insecurity (05/05/2021)   Hunger Vital Sign    Worried About Running Out of Food in the Last Year: Never true    Ran Out of Food in the Last Year: Never true  Transportation Needs: No Transportation Needs (05/05/2021)   PRAPARE - Hydrologist (Medical): No    Lack of Transportation (Non-Medical): No  Physical Activity: Not on file  Stress: Not on file  Social Connections: Not on file  Intimate Partner Violence: Not on file    Family History  Problem Relation Age of Onset   Hypertension Mother    Hypertension Father    Hypertension Brother     Past Surgical History:  Procedure Laterality Date   NO PAST SURGERIES      ROS: Review of Systems Negative  except as stated above  PHYSICAL EXAM: BP (!) 146/94 (BP Location: Left Arm, Patient Position: Sitting, Cuff Size: Normal)   Pulse (!) 110   Temp 98.4 F (36.9 C) (Oral)   Ht 6\' 1"  (1.854 m)   Wt (!) 304 lb (137.9 kg)   SpO2 97%   BMI 40.11 kg/m   Wt Readings from Last 3 Encounters:  06/22/22 (!) 304 lb (137.9 kg)  05/30/22 (!) 304 lb (137.9 kg)  04/24/22 (!) 311 lb 6.4 oz (141.3 kg)    Physical Exam   General appearance - alert, well appearing, middle-age obese African-American male and in no distress Mental status - normal mood, behavior, speech, dress, motor activity, and thought processes Neck - supple, no significant adenopathy Chest - clear to auscultation, no wheezes, rales or rhonchi, symmetric air entry Heart - normal rate, regular rhythm, normal S1, S2, no murmurs, rubs, clicks or gallops Musculoskeletal -right knee: Some soft tissue edema suggesting fluid.  No tenderness on palpation of the medial or lateral joint lines.  Mild crepitus on passive range of motion. Extremities - peripheral pulses normal, no pedal edema, no clubbing or cyanosis     Latest Ref Rng & Units 05/30/2022    9:15 AM 03/31/2022   10:09 PM 03/20/2022   10:12 AM  CMP  Glucose 70 - 99 mg/dL 97  111  97   BUN 6 - 24 mg/dL 12  15  10    Creatinine 0.76 - 1.27 mg/dL 1.05  1.22  0.97   Sodium 134 - 144 mmol/L 142  141  143   Potassium 3.5 - 5.2 mmol/L 4.5  4.2  4.5   Chloride 96 - 106 mmol/L 106  109  104   CO2 20 - 29 mmol/L 20  18  21    Calcium 8.7 - 10.2 mg/dL 10.0  9.5  10.1   Total Protein 6.5 - 8.1 g/dL  7.1  7.2   Total Bilirubin 0.3 - 1.2 mg/dL  0.3  0.5   Alkaline Phos 38 - 126 U/L  83  111   AST 15 - 41 U/L  17  13   ALT 0 - 44 U/L  16  12    Lipid Panel     Component Value Date/Time   CHOL 202 (H) 03/20/2022 1012   TRIG 150 (H) 03/20/2022 1012   HDL 41 03/20/2022 1012   CHOLHDL 4.9 03/20/2022 1012   CHOLHDL 5.2 12/09/2019  0527   VLDL 18 12/09/2019 0527   LDLCALC 134 (H)  03/20/2022 1012    CBC    Component Value Date/Time   WBC 9.4 03/31/2022 2209   RBC 4.71 03/31/2022 2209   HGB 14.7 03/31/2022 2209   HGB 15.6 03/20/2022 1012   HCT 45.4 03/31/2022 2209   HCT 46.9 03/20/2022 1012   PLT 414 (H) 03/31/2022 2209   PLT 460 (H) 03/20/2022 1012   MCV 96.4 03/31/2022 2209   MCV 93 03/20/2022 1012   MCH 31.2 03/31/2022 2209   MCHC 32.4 03/31/2022 2209   RDW 12.0 03/31/2022 2209   RDW 12.0 03/20/2022 1012   LYMPHSABS 2.3 03/31/2022 2209   MONOABS 0.8 03/31/2022 2209   EOSABS 0.4 03/31/2022 2209   BASOSABS 0.1 03/31/2022 2209    ASSESSMENT AND PLAN: 1. Essential hypertension Not at goal.  Increase Norvasc to 10 mg daily.  Follow-up with clinical pharmacist in 3 weeks for recheck. - amLODipine (NORVASC) 10 MG tablet; Take 1 tablet (10 mg total) by mouth daily.  Dispense: 30 tablet; Refill: 6 - atorvastatin (LIPITOR) 10 MG tablet; Take 1 tablet (10 mg total) by mouth daily.  Dispense: 30 tablet; Refill: 6 - valsartan (DIOVAN) 160 MG tablet; Take 1 tablet (160 mg total) by mouth daily.  Dispense: 30 tablet; Refill: 6  2. Cardiomyopathy of undetermined type ([place]) No signs on physical exam of decompensated heart failure.  We will recheck echo - ECHOCARDIOGRAM COMPLETE; Future  3. Obesity, Class III, BMI 40-49.9 (morbid obesity) ([place]) Encouraged him to eliminate sugary drinks from his diet.  Recommend low fat or 1% milk instead of regular milk. - Amb ref to Medical Nutrition Therapy-MNT  4. Mixed hyperlipidemia Continue Lipitor. - Hepatic Function Panel  5. Tobacco dependence Advised to quit.  Patient not ready to give a trial of quitting.  6. Primary osteoarthritis of right knee Stop Voltaren gel.  Recommend trying Celebrex.  Will refer to orthopedics. - DG Knee Complete 4 Views Right; Future - Ambulatory referral to Orthopedic Surgery - celecoxib (CELEBREX) 200 MG capsule; Take 1 capsule (200 mg total) by mouth daily.  Dispense: 30 capsule;  Refill: 1  7. Screening for colon cancer - Ambulatory referral to Gastroenterology     Patient was given the opportunity to ask questions.  Patient verbalized understanding of the plan and was able to repeat key elements of the plan.   This documentation was completed using Radio producer.  Any transcriptional errors are unintentional.  Orders Placed This Encounter  Procedures   DG Knee Complete 4 Views Right   Hepatic Function Panel   Ambulatory referral to Orthopedic Surgery   Ambulatory referral to Gastroenterology   Amb ref to Medical Nutrition Therapy-MNT   ECHOCARDIOGRAM COMPLETE     Requested Prescriptions   Signed Prescriptions Disp Refills   celecoxib (CELEBREX) 200 MG capsule 30 capsule 1    Sig: Take 1 capsule (200 mg total) by mouth daily.   amLODipine (NORVASC) 10 MG tablet 30 tablet 6    Sig: Take 1 tablet (10 mg total) by mouth daily.   atorvastatin (LIPITOR) 10 MG tablet 30 tablet 6    Sig: Take 1 tablet (10 mg total) by mouth daily.   valsartan (DIOVAN) 160 MG tablet 30 tablet 6    Sig: Take 1 tablet (160 mg total) by mouth daily.    Return in about 4 months (around 10/21/2022) for Appt with [facility] in 3 wks for BP check.  Karle Plumber,  MD, Rosalita Chessman

## 2022-06-23 ENCOUNTER — Ambulatory Visit: Payer: Self-pay | Admitting: *Deleted

## 2022-06-23 ENCOUNTER — Other Ambulatory Visit (HOSPITAL_COMMUNITY): Payer: Self-pay

## 2022-06-23 LAB — HEPATIC FUNCTION PANEL
ALT: 12 IU/L (ref 0–44)
AST: 13 IU/L (ref 0–40)
Albumin: 4.5 g/dL (ref 4.1–5.1)
Alkaline Phosphatase: 110 IU/L (ref 44–121)
Bilirubin Total: 0.5 mg/dL (ref 0.0–1.2)
Bilirubin, Direct: 0.16 mg/dL (ref 0.00–0.40)
Total Protein: 7.1 g/dL (ref 6.0–8.5)

## 2022-06-23 NOTE — Telephone Encounter (Signed)
Reason for Disposition  [1] Follow-up call to recent contact AND [2] information only call, no triage required  Answer Assessment - Initial Assessment Questions 1. REASON FOR CALL or QUESTION: "What is your reason for calling today?" or "How can I best help you?" or "What question do you have that I can help answer?"     Pt called in and was given his lab results message from Dr. Wynetta Emery dated 05/31/2022 9:10 AM.  Protocols used: Information Only Call - No Triage-A-AH

## 2022-06-23 NOTE — Telephone Encounter (Signed)
  Chief Complaint: Lab results given.   No questions from pt Symptoms: N/A Frequency: N/A Pertinent Negatives: Patient denies N/A Disposition: [] ED /[] Urgent Care (no appt availability in office) / [] Appointment(In office/virtual)/ []  [facility]/ [x] Home Care/ [] Refused Recommended Disposition /[] [facility]/ []  Follow-up with PCP Additional Notes:

## 2022-07-03 ENCOUNTER — Encounter: Payer: Self-pay | Admitting: Sports Medicine

## 2022-07-03 ENCOUNTER — Ambulatory Visit (INDEPENDENT_AMBULATORY_CARE_PROVIDER_SITE_OTHER): Payer: BLUE CROSS/BLUE SHIELD | Admitting: Sports Medicine

## 2022-07-03 ENCOUNTER — Ambulatory Visit (INDEPENDENT_AMBULATORY_CARE_PROVIDER_SITE_OTHER): Payer: Self-pay

## 2022-07-03 ENCOUNTER — Other Ambulatory Visit: Payer: Self-pay

## 2022-07-03 DIAGNOSIS — G8929 Other chronic pain: Secondary | ICD-10-CM

## 2022-07-03 DIAGNOSIS — M25561 Pain in right knee: Secondary | ICD-10-CM

## 2022-07-03 DIAGNOSIS — M1711 Unilateral primary osteoarthritis, right knee: Secondary | ICD-10-CM

## 2022-07-03 MED ORDER — CELECOXIB 200 MG PO CAPS
200.0000 mg | ORAL_CAPSULE | Freq: Every day | ORAL | 1 refills | Status: DC | PRN
  Filled 2022-07-03 – 2022-07-17 (×2): qty 30, 30d supply, fill #0

## 2022-07-03 MED ORDER — METHYLPREDNISOLONE ACETATE 40 MG/ML IJ SUSP
80.0000 mg | INTRAMUSCULAR | Status: AC | PRN
  Administered 2022-07-03: 80 mg via INTRA_ARTICULAR

## 2022-07-03 MED ORDER — LIDOCAINE HCL 1 % IJ SOLN
2.0000 mL | INTRAMUSCULAR | Status: AC | PRN
  Administered 2022-07-03: 2 mL

## 2022-07-03 MED ORDER — BUPIVACAINE HCL 0.25 % IJ SOLN
2.0000 mL | INTRAMUSCULAR | Status: AC | PRN
  Administered 2022-07-03: 2 mL via INTRA_ARTICULAR

## 2022-07-03 NOTE — Progress Notes (Signed)
Manuel Welch. - 52 y.o. male MRN [id number]  Date of birth: 16-Nov-1970  Office Visit Note: Visit Date: 07/03/2022 PCP: Ladell Pier, MD Referred by: Ladell Pier, MD  Subjective: Chief Complaint  Patient presents with   Right Knee - Pain   HPI: Manuel Welch. is a pleasant 52 y.o. male who presents today for chronic right knee pain.   PMHx of HTN, obesity,CM with a EF of 30 to 35% on echo 2021, tob dep, cocaine use, OA knee.  Review of internal medicine note on 06/22/2022 by Dr. Wynetta Emery, referral sent at that time.  It does appear at that time she sent in Celebrex for him, however he has not picked this up nor started this medication - awaiting my recommendation.  Roselyn Reef has been told that he likely has arthritis of the right knee.  Does not recall any recent x-rays of the knees.  The right knee has bothered him for a few years, however here over the last few weeks his knee pain has worsened.  He states he did hurt this when he was younger as he played a lot of basketball on hard concrete, but denies any previous surgeries.  States the right knee will swell up on him at times, today mildly swollen.  Denies any redness or warmth.  Pain is worse on the inside of the knee.  Does report crepitus and at times feels like the knee will try to give out on him.  He does note that he is overweight and is working on losing weight.  He has taken over-the-counter ibuprofen or Tylenol at times, but nothing consistently as this does not significantly help.  Pertinent ROS were reviewed with the patient and found to be negative unless otherwise specified above in HPI.   Assessment & Plan: Visit Diagnoses:  1. Primary osteoarthritis of right knee   2. Chronic pain of right knee   3. Obesity, Class III, BMI 40-49.9 (morbid obesity) ([place])    Plan: Discussed with Manuel Welch that he has had an exacerbation of his chronic right knee osteoarthritis.  We did review x-rays today which do show end-stage,  bone-on-bone narrowing of the medial joint space.  He has some limitation in flexion secondary to his osteoarthritis.  Over-the-counter medications in the past have not provided him good relief.  We discussed all treatment options such as prescription-strength oral medication, HEP, physical therapy, injection therapy, surgical options.  Through shared decision making, elected to proceed with corticosteroid injection into the right knee, patient tolerated well and had good improvement just from the anesthetic portion following this.  We will have him take Celebrex 200 mg once daily as needed with.  If the knee is swollen, recommended ice.  He will continue working on stretching and range of motion about the knee.  I would like to see him back in the next 4 to 6 weeks to see what sort of improvement he gets from these modalities.  He will continue to work on healthy weight loss, we will obtain a BMI at next visit.  Follow-up: Return in about 5 weeks (around 08/07/2022) for Follow-up in 4-6 weeks with Dr. Rolena Infante for right knee pain.   Meds & Orders:  Meds ordered this encounter  Medications   celecoxib (CELEBREX) 200 MG capsule    Sig: Take 1 capsule (200 mg total) by mouth daily as needed.    Dispense:  30 capsule    Refill:  1    Orders Placed  This Encounter  Procedures   Large Joint Inj: R knee   XR Knee Complete 4 Views Right     Procedures: Large Joint Inj: R knee on 07/03/2022 11:18 AM Details: 22 G 1.5 in needle, anterolateral approach Medications: 2 mL lidocaine 1 %; 2 mL bupivacaine 0.25 %; 80 mg methylPREDNISolone acetate 40 MG/ML Outcome: tolerated well, no immediate complications  Knee Injection, right: After discussion on risks/benefits/indications, informed verbal consent was obtained and a timeout was performed, patient was seated on exam table. The patient's knee was prepped with Betadine and alcohol swab and utilizing anterolateral approach, the patient's knee was injected  intraarticularly with 2:2:2 lidocaine 1%:bupivicaine 0.25%:depomedrol. Patient tolerated the procedure well without immediate complications.  Procedure, treatment alternatives, risks and benefits explained, specific risks discussed. Consent was given by the patient. Immediately prior to procedure a time out was called to verify the correct patient, procedure, equipment, support staff and site/side marked as required. Patient was prepped and draped in the usual sterile fashion.          Clinical History: No specialty comments available.  He reports that he has been smoking cigarettes. He has a 7.25 pack-year smoking history. He has never used smokeless tobacco.  Recent Labs    03/20/22 1012  HGBA1C 5.4    Objective:    Physical Exam  Gen: Well-appearing, in no acute distress; non-toxic CV: Well-perfused. Warm.  Resp: Breathing unlabored on room air; no wheezing. Psych: Fluid speech in conversation; appropriate affect; normal thought process Neuro: Sensation intact throughout. No gross coordination deficits.   Ortho Exam - Right knee: Inspection of the right knee shows a trace effusion of the knee without significant warmth or erythema.  There is positive TTP all throughout the medial joint line.  There is patellar crepitus of the right knee with flexion and extension.  Positive medial joint line tenderness.  No varus or valgus instability, ligamentously intact.  5/5 strength with knee flexion and extension, although pain with endrange flexion.  Range of motion from 0-120 degrees, with pain at endrange flexion. NVI.  Imaging: XR Knee Complete 4 Views Right  Result Date: 07/03/2022 4 views of the right knee including bilateral AP standing Rosenberg; sunrise and lateral femoral ordered and reviewed by myself.  X-rays demonstrate end-stage, bone-on-bone narrowing and OA of the medial joint space.  There is bony sclerosis noted of the medial femoral condyle and medial tibial plateau.  There  is at least moderate patellofemoral arthritis as well.  Spurring throughout the tibiofemoral joint, medial most involved.  No acute fracture noted.   Past Medical/Family/Surgical/Social History: Medications & Allergies reviewed per EMR, new medications updated. Patient Active Problem List   Diagnosis Date Noted   Mixed hyperlipidemia 06/22/2022   Primary osteoarthritis of right knee 03/20/2022   Encounter to establish care 03/11/2020   Essential hypertension 03/11/2020   Tobacco dependence 03/11/2020   Cardiomyopathy (HCC)    Cocaine use    Obesity, Class III, BMI 40-49.9 (morbid obesity) (HCC)    Multifocal pneumonia 12/09/2019   Atypical pneumonia 12/08/2019   Chest pain 12/08/2019   Accelerated hypertension    Elevated troponin    Tobacco abuse    Past Medical History:  Diagnosis Date   Hypertension    Family History  Problem Relation Age of Onset   Hypertension Mother    Hypertension Father    Hypertension Brother    Past Surgical History:  Procedure Laterality Date   NO PAST SURGERIES     Social  History   Occupational History   Not on file  Tobacco Use   Smoking status: Every Day    Packs/day: 0.25    Years: 29.00    Total pack years: 7.25    Types: Cigarettes   Smokeless tobacco: Never  Vaping Use   Vaping Use: Never used  Substance and Sexual Activity   Alcohol use: Yes   Drug use: No   Sexual activity: Yes

## 2022-07-03 NOTE — Progress Notes (Signed)
Chronic pain No injury Denies OTC medication for pain  Here today for xrays

## 2022-07-03 NOTE — Patient Instructions (Signed)
Right knee pain - from osteoarthritis of right knee.  Things for you to do for this:  - you had an injection into the knee today. Take it easy for the next 1-2 days. - you may take Celebrex medication --> take 1 pill (200mg ) once a day with food - continue to stretch the knee - work on health weight loss  - follow-up with Dr. Rolena Infante in the next 4-6 weeks

## 2022-07-04 ENCOUNTER — Ambulatory Visit (HOSPITAL_COMMUNITY): Payer: BLUE CROSS/BLUE SHIELD

## 2022-07-17 ENCOUNTER — Other Ambulatory Visit: Payer: Self-pay

## 2022-07-19 ENCOUNTER — Ambulatory Visit: Payer: BLUE CROSS/BLUE SHIELD | Admitting: Skilled Nursing Facility1

## 2022-07-26 ENCOUNTER — Ambulatory Visit (HOSPITAL_COMMUNITY)
Admission: RE | Admit: 2022-07-26 | Discharge: 2022-07-26 | Disposition: A | Discharge status: Home / Self Care | Payer: BLUE CROSS/BLUE SHIELD | Source: Ambulatory Visit | Attending: Internal Medicine | Admitting: Internal Medicine

## 2022-07-26 DIAGNOSIS — F17200 Nicotine dependence, unspecified, uncomplicated: Secondary | ICD-10-CM | POA: Insufficient documentation

## 2022-07-26 DIAGNOSIS — I429 Cardiomyopathy, unspecified: Secondary | ICD-10-CM | POA: Insufficient documentation

## 2022-07-26 DIAGNOSIS — E785 Hyperlipidemia, unspecified: Secondary | ICD-10-CM | POA: Diagnosis not present

## 2022-07-26 DIAGNOSIS — R079 Chest pain, unspecified: Secondary | ICD-10-CM | POA: Insufficient documentation

## 2022-07-26 DIAGNOSIS — I428 Other cardiomyopathies: Secondary | ICD-10-CM | POA: Diagnosis not present

## 2022-07-26 LAB — ECHOCARDIOGRAM COMPLETE
AR max vel: 4.09 cm2
AV Area VTI: 3.95 cm2
AV Area mean vel: 3.97 cm2
AV Mean grad: 3 mmHg
AV Peak grad: 5.4 mmHg
Ao pk vel: 1.16 m/s
Area-P 1/2: 5.52 cm2
S' Lateral: 4.1 cm

## 2022-07-26 NOTE — Progress Notes (Signed)
Echocardiogram 2D Echocardiogram has been performed.  Manuel Welch 07/26/2022, 1:51 PM

## 2022-07-28 ENCOUNTER — Ambulatory Visit: Payer: BLUE CROSS/BLUE SHIELD | Admitting: Pharmacist

## 2022-08-04 ENCOUNTER — Ambulatory Visit: Payer: BLUE CROSS/BLUE SHIELD | Admitting: Pharmacist

## 2022-08-09 ENCOUNTER — Ambulatory Visit (INDEPENDENT_AMBULATORY_CARE_PROVIDER_SITE_OTHER): Payer: BLUE CROSS/BLUE SHIELD | Admitting: Sports Medicine

## 2022-08-09 ENCOUNTER — Telehealth: Payer: Self-pay

## 2022-08-09 ENCOUNTER — Encounter: Payer: Self-pay | Admitting: Sports Medicine

## 2022-08-09 VITALS — Ht 72.0 in | Wt 301.6 lb

## 2022-08-09 DIAGNOSIS — G8929 Other chronic pain: Secondary | ICD-10-CM | POA: Diagnosis not present

## 2022-08-09 DIAGNOSIS — M25561 Pain in right knee: Secondary | ICD-10-CM

## 2022-08-09 DIAGNOSIS — M1711 Unilateral primary osteoarthritis, right knee: Secondary | ICD-10-CM

## 2022-08-09 NOTE — Telephone Encounter (Signed)
VOB submitted for [place], right knee.

## 2022-08-09 NOTE — Progress Notes (Signed)
No change since last visit. Injection helped only a few days

## 2022-08-09 NOTE — Progress Notes (Signed)
Manuel Welch. - 52 y.o. male MRN [id number]  Date of birth: 29-Jan-1971  Office Visit Note: Visit Date: 08/09/2022 PCP: Ladell Pier, MD Referred by: Ladell Pier, MD  Subjective: Chief Complaint  Patient presents with   Right Knee - Pain   HPI: Manuel Welch. is a pleasant 52 y.o. male who presents today for chronic right knee pain and osteoarthritis.  PMHx of HTN, obesity,CM with a EF of 30 to 35% on echo 2021, tob dep, cocaine use, OA knee.    He presents for follow-up right knee arthritis.  He did get almost complete relief of his pain following the injection, however it only lasted about 1-2 weeks.  He is also continuing his Celebrex 200 mg once daily, notices some difference but continues with pain.  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  He is not interested in any surgical procedure under replacement.  Body mass index is 40.9 kg/m.  Pertinent ROS were reviewed with the patient and found to be negative unless otherwise specified above in HPI.   Assessment & Plan: Visit Diagnoses:  1. Primary osteoarthritis of right knee   2. Chronic pain of right knee    Plan: Discussed with Malikai today treatment options for his knee arthritis.  He does have end-stage bone-on-bone medial joint collapse of the knee.  He did get excellent relief of his pain with the previous corticosteroid injection, however it only lasted for about 1-2 weeks unfortunately.  We discussed transitioning to a different NSAID from Celebrex, however he would like to hold on this for now due to cost issues.  We discussed possibly trying a medial unloader brace but that cost of this was out of his price range.  We will consider this down the road if he is to meet his deductible.  We did have a brief discussion regarding knee replacement.  Either total or partial, he does not wish to proceed with surgical intervention at this time.  He will continue with Celebrex and home  therapy.  Will obtain prior Auth for Zilretta injection, as I do think he would be a candidate for extended release and longer acting injection therapy.  Follow-up in 6 weeks.  The patient would benefit from a long-acting corticosteroid injection, I.e. Zilretta, as they have tried topical and oral anti-inflammatories such as advil, motrin, and celebrex without much relief. The have undergone nonpharmacologic therapy with lifestyle modification, home exercise or physical therapy, and attempted weight loss and exercise regimens without significant relief. They have failed long-term benefit from previous corticosteroid injection therapy, got excellent relief but only for 1-2 weeks.  For these reasons, I do think they are an appropriate candidate for Zilretta.  We will send authorization through the insurance company, will notify patient when this is approved and we will schedule in clinic for injection therapy at this time when works for the patient - will be about 6 weeks from this visit if approved.   Follow-up: Return in about 6 weeks (around 09/20/2022) for for right knee OA.   Meds & Orders: No orders of the defined types were placed in this encounter.  No orders of the defined types were placed in this encounter.    Procedures: No procedures performed      Clinical History: No specialty comments available.  He reports that he has been smoking cigarettes. He has a 7.25 pack-year smoking history. He has never used smokeless tobacco.  Recent  Labs    03/20/22 1012  HGBA1C 5.4    Objective:   Vital Signs: Ht 6' (1.829 m)   Wt (!) 301 lb 9.6 oz (136.8 kg)   BMI 40.90 kg/m   Physical Exam  Gen: Well-appearing, in no acute distress; non-toxic CV: Well-perfused. Warm.  Resp: Breathing unlabored on room air; no wheezing. Psych: Fluid speech in conversation; appropriate affect; normal thought process Neuro: Sensation intact throughout. No gross coordination deficits.   Ortho Exam -   Inspection of the right knee shows a small effusion of the knee without significant warmth or erythema.  There is positive TTP all throughout the medial joint line.  There is patellar crepitus of the right knee with flexion and extension.  Positive medial joint line tenderness. There is slight varus formation of the knee with instability with valgus stress without giveway. Knee is ligamentously intact.  5/5 strength with knee flexion and extension, although pain with endrange flexion.  Range of motion from 0-120 degrees, with pain at endrange flexion. NVI.   Imaging: No results found.  Past Medical/Family/Surgical/Social History: Medications & Allergies reviewed per EMR, new medications updated. Patient Active Problem List   Diagnosis Date Noted   Mixed hyperlipidemia 06/22/2022   Primary osteoarthritis of right knee 03/20/2022   Encounter to establish care 03/11/2020   Essential hypertension 03/11/2020   Tobacco dependence 03/11/2020   Cardiomyopathy ([place])    Cocaine use    Obesity, Class III, BMI 40-49.9 (morbid obesity) ([place])    Multifocal pneumonia 12/09/2019   Atypical pneumonia 12/08/2019   Chest pain 12/08/2019   Accelerated hypertension    Elevated troponin    Tobacco abuse    Past Medical History:  Diagnosis Date   Hypertension    Family History  Problem Relation Age of Onset   Hypertension Mother    Hypertension Father    Hypertension Brother    Past Surgical History:  Procedure Laterality Date   NO PAST SURGERIES     Social History   Occupational History   Not on file  Tobacco Use   Smoking status: Every Day    Packs/day: 0.25    Years: 29.00    Total pack years: 7.25    Types: Cigarettes   Smokeless tobacco: Never  Vaping Use   Vaping Use: Never used  Substance and Sexual Activity   Alcohol use: Yes   Drug use: No   Sexual activity: Yes

## 2022-08-09 NOTE — Telephone Encounter (Signed)
-----   Message from Otelia Sergeant, LAT sent at 08/09/2022 10:28 AM EST ----- Regarding: zilretta Zilretta authorization for this patient please.

## 2022-08-11 ENCOUNTER — Telehealth: Payer: Self-pay

## 2022-08-11 NOTE — Telephone Encounter (Signed)
Called and left a VM for patient to CB concerning injection.

## 2022-08-21 ENCOUNTER — Other Ambulatory Visit: Payer: Self-pay

## 2022-08-31 ENCOUNTER — Other Ambulatory Visit: Payer: Self-pay

## 2022-08-31 NOTE — Progress Notes (Signed)
Patient outreached by Donney Rankins, PharmD Candidate on 08/31/2022 to discuss hypertension.    Patient does not have an automated home blood pressure machine.  He does report taking his medications daily.  He denies any symptoms of dizziness or lightheadedness.  As far as his diet, for breakfast he will eat a hot dog, sausage, or potatoes with onions.  For lunch and dinner, he will eat baked chicken, baked pork chops, or vegetables.  He occasionally eats fruit.  He does not snack during the day.  He drinks water and avoids sodas and juice.  He smokes 5-6 cigarettes daily.  He is not willing to quit at this time.  He drinks 3 beers per week.  He does not exercise regularly.   Medication review was performed. They are taking medications as prescribed.   The following barriers to adherence were noted:  - They do not have cost concerns.  - They do not have transportation concerns.  - They do not need assistance obtaining refills.  - They do not occasionally forget to take some of their prescribed medications.  - They do not feel like one/some of their medications make them feel poorly.  - They do not have questions or concerns about their medications.  - They do have follow up scheduled with their primary care provider/cardiologist.   The following interventions were completed:  - Medications were reviewed  - Patient was educated on proper technique to check home blood pressure and reminded to bring home machine and readings to next provider appointment  - Patient was educated on how to access home blood pressure machine  - Patient was counseled on lifestyle modifications to improve blood pressure, including exercise and diet  - Patient was counseled on benefit of tobacco cessation   The patient has follow up scheduled: 10/23/2022  PCP: [facility]  PharmD Candidate 2024   Maryan Puls, PharmD PGY-1 [facility]  Resident

## 2022-09-20 ENCOUNTER — Ambulatory Visit (INDEPENDENT_AMBULATORY_CARE_PROVIDER_SITE_OTHER): Payer: BLUE CROSS/BLUE SHIELD | Admitting: Sports Medicine

## 2022-09-20 ENCOUNTER — Other Ambulatory Visit: Payer: Self-pay

## 2022-09-20 ENCOUNTER — Encounter: Payer: Self-pay | Admitting: Sports Medicine

## 2022-09-20 DIAGNOSIS — F17200 Nicotine dependence, unspecified, uncomplicated: Secondary | ICD-10-CM | POA: Diagnosis not present

## 2022-09-20 DIAGNOSIS — M1711 Unilateral primary osteoarthritis, right knee: Secondary | ICD-10-CM

## 2022-09-20 MED ORDER — LIDOCAINE HCL 1 % IJ SOLN
2.0000 mL | INTRAMUSCULAR | Status: AC | PRN
Start: 2022-09-20 — End: 2022-09-20
  Administered 2022-09-20: 2 mL

## 2022-09-20 MED ORDER — METHYLPREDNISOLONE ACETATE 40 MG/ML IJ SUSP
80.0000 mg | INTRAMUSCULAR | Status: AC | PRN
Start: 2022-09-20 — End: 2022-09-20
  Administered 2022-09-20: 80 mg via INTRA_ARTICULAR

## 2022-09-20 MED ORDER — BUPIVACAINE HCL 0.25 % IJ SOLN
2.0000 mL | INTRAMUSCULAR | Status: AC | PRN
Start: 2022-09-20 — End: 2022-09-20
  Administered 2022-09-20: 2 mL via INTRA_ARTICULAR

## 2022-09-20 NOTE — Progress Notes (Signed)
Manuel Welch. - 52 y.o. male MRN [id number]  Date of birth: 21-Aug-1970  Office Visit Note: Visit Date: 09/20/2022 PCP: Ladell Pier, MD Referred by: Ladell Pier, MD  Subjective: Chief Complaint  Patient presents with   Right Knee - Pain   HPI: Manuel Welch. is a pleasant 52 y.o. male who presents today for right knee OA - acute on chronic pain.  PMHx of HTN, obesity,CM with a EF of 30 to 35% on echo 2021, tob dependence, cocaine use, OA knee.    Manuel Welch was last seen at the office on 08/09/2022.  We did obtain x-rays which showed end-stage, bone-on-bone collapse of the medial knee joint.  Did proceed with corticosteroid injection which he states helped him for the first few weeks or month.  Here over the last few weeks however his knee pain has been exacerbated and is causing him pain and limping with his gait.  He does take Celebrex 200 mg once daily which helps to some extent.  Does note some crepitus and grinding about the knee.  Manuel Welch continues to smoke cigarettes but he is working on reducing them.  Used to smoke up to a pack a day, now 1 pack will last him about every 3 days.  - BMI (as of 08/09/22): 40.9 kg/m2 - He does use tobacco --> smokes 1/3 ppd.  Pertinent ROS were reviewed with the patient and found to be negative unless otherwise specified above in HPI.   Assessment & Plan: Visit Diagnoses:  1. Primary osteoarthritis of right knee   2. Obesity, Class III, BMI 40-49.9 (morbid obesity)   3. Tobacco use disorder    Plan: Discussed with [name] that he has had another exacerbation of his chronic right knee osteoarthritis.  Previous x-rays showed end-stage collapse of the medial joint with tricompartmental arthritic change.  He did get good relief from the first corticosteroid injection, although only lasted for up to 1 month.  He is interested in repeating today, through shared decision making we did repeat this.  He will continue his Celebrex 200 mg daily for his  arthritic pain.  He may use ice and/or Tylenol for any postinjection pain. He is aware he would need to wait > 3 months if he were to undergo joint replacement.  At this time, given the physical limitations from his knee pain he is interested in meeting with one of our knee surgeons to discuss possible knee replacement and what that would entail.  Did discuss general overview and expected recovery. However, I did discuss with [name] that he would need to ensure he loses a few pounds to bring his BMI less than 40 (now at 40.9) as well as working on his tobacco cessation.  He has cut back on smoking, but ideally would like him to be cigarette free for healing purposes before knee replacement.  He does have a history of cardiomyopathy with reduced ejection fraction, it does appear on chart review that he is repeating the echo. Given his comorbidities, do think he needs medical clearance from his primary physician and/or cardiology prior to this procedure.  Recommended he start getting this set up.  He will make an appointment to follow-up with [name] to discuss TKA.  Follow-up: Return for Make appt with [name] for discussion on knee replacement.   Meds & Orders: No orders of the defined types were placed in this encounter.  No orders of the defined types were placed in this encounter.  Procedures: Large Joint Inj: R knee on 09/20/2022 2:48 PM Indications: pain Details: 22 G 1.5 in needle, anterolateral approach Medications: 2 mL lidocaine 1 %; 2 mL bupivacaine 0.25 %; 80 mg methylPREDNISolone acetate 40 MG/ML Outcome: tolerated well, no immediate complications  Knee Injection, Right: After discussion on risks/benefits/indications, informed verbal consent was obtained and a timeout was performed, patient was seated on exam table. The patient's knee was prepped with Betadine and alcohol swab and utilizing anterolateral approach, the patient's knee was injected intraarticularly with 2:2:1 lidocaine  1%:bupivicaine 0.25%:depomedrol. Patient tolerated the procedure well without immediate complications.  Procedure, treatment alternatives, risks and benefits explained, specific risks discussed. Consent was given by the patient. Immediately prior to procedure a time out was called to verify the correct patient, procedure, equipment, support staff and site/side marked as required. Patient was prepped and draped in the usual sterile fashion.          Clinical History: No specialty comments available.  He reports that he has been smoking cigarettes. He has a 7.25 pack-year smoking history. He has never used smokeless tobacco.  Recent Labs    03/20/22 1012  HGBA1C 5.4    Objective:    Physical Exam  Gen: Well-appearing, in no acute distress; non-toxic CV:  Well-perfused. Warm.  Resp: Breathing unlabored on room air; no wheezing. Psych: Fluid speech in conversation; appropriate affect; normal thought process Neuro: Sensation intact throughout. No gross coordination deficits.   Ortho Exam - Right knee: There is no significant swelling or effusion noted.  There is positive TTP throughout the medial joint line.  Moderate patellar crepitus with knee flexion and extension.  There is some pseudo instability to valgus stress, but firm endpoint.  Range of motion from 0-120 degrees with pain at endrange flexion.  Strength 5/5 about the knee.  Neurovascular intact distally.  Imaging:  R-Knee XR 07/03/22: 4 views of the right knee including bilateral AP standing Rosenberg;  sunrise and lateral femoral ordered and reviewed by myself.  X-rays  demonstrate end-stage, bone-on-bone narrowing and OA of the medial joint  space.  There is bony sclerosis noted of the medial femoral condyle and  medial tibial plateau.  There is at least moderate patellofemoral  arthritis as well.  Spurring throughout the tibiofemoral joint, medial  most involved.  No acute fracture noted.   Past  Medical/Family/Surgical/Social History: Medications & Allergies reviewed per EMR, new medications updated. Patient Active Problem List   Diagnosis Date Noted   Mixed hyperlipidemia 06/22/2022   Primary osteoarthritis of right knee 03/20/2022   Encounter to establish care 03/11/2020   Essential hypertension 03/11/2020   Tobacco dependence 03/11/2020   Cardiomyopathy    Cocaine use    Obesity, Class III, BMI 40-49.9 (morbid obesity)    Multifocal pneumonia 12/09/2019   Atypical pneumonia 12/08/2019   Chest pain 12/08/2019   Accelerated hypertension    Elevated troponin    Tobacco abuse    Past Medical History:  Diagnosis Date   Hypertension    Family History  Problem Relation Age of Onset   Hypertension Mother    Hypertension Father    Hypertension Brother    Past Surgical History:  Procedure Laterality Date   NO PAST SURGERIES     Social History   Occupational History   Not on file  Tobacco Use   Smoking status: Every Day    Packs/day: 0.25    Years: 29.00    Additional pack years: 0.00    Total pack years:  7.25    Types: Cigarettes   Smokeless tobacco: Never  Vaping Use   Vaping Use: Never used  Substance and Sexual Activity   Alcohol use: Yes   Drug use: No   Sexual activity: Yes

## 2022-09-28 ENCOUNTER — Other Ambulatory Visit: Payer: Self-pay

## 2022-10-04 ENCOUNTER — Ambulatory Visit: Payer: Commercial Managed Care - HMO | Admitting: Orthopaedic Surgery

## 2022-10-23 ENCOUNTER — Encounter: Payer: Self-pay | Admitting: Internal Medicine

## 2022-10-23 ENCOUNTER — Other Ambulatory Visit: Payer: Self-pay

## 2022-10-23 ENCOUNTER — Ambulatory Visit: Payer: BLUE CROSS/BLUE SHIELD | Attending: Internal Medicine | Admitting: Internal Medicine

## 2022-10-23 VITALS — BP 113/78 | HR 91 | Temp 98.5°F | Ht 72.0 in | Wt 298.0 lb

## 2022-10-23 DIAGNOSIS — E66813 Obesity, class 3: Secondary | ICD-10-CM

## 2022-10-23 DIAGNOSIS — Z1211 Encounter for screening for malignant neoplasm of colon: Secondary | ICD-10-CM

## 2022-10-23 DIAGNOSIS — F17200 Nicotine dependence, unspecified, uncomplicated: Secondary | ICD-10-CM

## 2022-10-23 DIAGNOSIS — I429 Cardiomyopathy, unspecified: Secondary | ICD-10-CM

## 2022-10-23 DIAGNOSIS — E782 Mixed hyperlipidemia: Secondary | ICD-10-CM | POA: Diagnosis not present

## 2022-10-23 DIAGNOSIS — I10 Essential (primary) hypertension: Secondary | ICD-10-CM | POA: Diagnosis not present

## 2022-10-23 DIAGNOSIS — M1711 Unilateral primary osteoarthritis, right knee: Secondary | ICD-10-CM

## 2022-10-23 MED ORDER — CELECOXIB 200 MG PO CAPS
200.0000 mg | ORAL_CAPSULE | Freq: Every day | ORAL | 6 refills | Status: DC | PRN
Start: 2022-10-23 — End: 2023-04-05
  Filled 2022-10-23: qty 30, 30d supply, fill #0

## 2022-10-23 NOTE — Patient Instructions (Signed)
Please call [facility] located at [address] [place], [place] [address] to schedule your colonoscopy for colon cancer screening.  The phone number is PH# [phone]

## 2022-10-23 NOTE — Progress Notes (Signed)
Patient ID: Manuel Abbasi., male    DOB: Apr 27, 1971  MRN: [id number]  CC: Hypertension (HTN f/u. Med refills./)   Subjective: Manuel Welch is a 51 y.o. male who presents for chronic disease management His concerns today include:  Patient with history of HTN, obesity,CM with a EF of 30 to 35%; increased to 45-50% on echo 07/2022, tob dep, cocaine use, OA knee   Did not bring meds with him today and does not recall names but states he takes 4 pills daily which is correct based on his med list of amlodipine 10 mg daily, atorvastatin 10 mg daily, Diovan 160 mg daily and Celebrex 200 mg daily.  HTN/CM: Echo done 07/2022 revealed improvement of his EF to 45 to 50%. Should be on Norvasc 10 mg daily and Diovan 160 mg daily but did not bring meds with him.  Reports he took already for this a.m.  Eating less fried, more baked food He denies any chest pains, shortness of breath, lower extremity edema.  HL: Reports compliance with taking atorvastatin 10 mg daily.  Last LDL was 134 in October of last year.  Tobacco dependence: Still smoking 1/3 pk/day.  Not ready to quit.  OA right knee: Placed on Celebrex.  Has seen sports medicine Dr. Shon Baton.  Has had some steroid injections to the knee.  He would like to pursue TKA. No showed appt with Dr. Roda Shutters 10/04/2022 due to lack of transportation. He was advised of the need to get BMI below 40 and smoking cessation.  Wgh down 6 lbs since 06/2022.  Eating less fried foods, more baked foods.  No sugary drinks  -Needs note for work stating he was seen today.  Works for the city [employer], [employer] on [employer].  HM: He was called by [facility] to schedule colonoscopy but did not respond.  Patient states he does not recall.  He would like for me to give him the information so that he can call them.  Looks like patient now has [employer] and is out of work with [place].  Patient Active Problem List   Diagnosis Date Noted   Mixed  hyperlipidemia 06/22/2022   Primary osteoarthritis of right knee 03/20/2022   Encounter to establish care 03/11/2020   Essential hypertension 03/11/2020   Tobacco dependence 03/11/2020   Cardiomyopathy ([facility])    Cocaine use    Obesity, Class III, BMI 40-49.9 (morbid obesity) ([facility])    Multifocal pneumonia 12/09/2019   Atypical pneumonia 12/08/2019   Chest pain 12/08/2019   Accelerated hypertension    Elevated troponin    Tobacco abuse      Current Outpatient Medications on File Prior to Visit  Medication Sig Dispense Refill   amLODipine (NORVASC) 10 MG tablet Take 1 tablet (10 mg total) by mouth daily. 30 tablet 6   atorvastatin (LIPITOR) 10 MG tablet Take 1 tablet (10 mg total) by mouth daily. 30 tablet 6   valsartan (DIOVAN) 160 MG tablet Take 1 tablet (160 mg total) by mouth daily. 30 tablet 6   No current facility-administered medications on file prior to visit.    Allergies  Allergen Reactions   Bee Venom     Social History   Socioeconomic History   Marital status: Single    Spouse name: Not on file   Number of children: Not on file   Years of education: Not on file   Highest education level: Not on file  Occupational History   Not  on file  Tobacco Use   Smoking status: Every Day    Packs/day: 0.25    Years: 29.00    Additional pack years: 0.00    Total pack years: 7.25    Types: Cigarettes   Smokeless tobacco: Never  Vaping Use   Vaping Use: Never used  Substance and Sexual Activity   Alcohol use: Yes   Drug use: No   Sexual activity: Yes  Other Topics Concern   Not on file  Social History Narrative   Lives at home with mother.   Social Determinants of Health   Financial Resource Strain: Not on file  Food Insecurity: No Food Insecurity (05/05/2021)   Hunger Vital Sign    Worried About Running Out of Food in the Last Year: Never true    Ran Out of Food in the Last Year: Never true  Transportation Needs: No Transportation Needs (05/05/2021)    PRAPARE - Administrator, Civil Service (Medical): No    Lack of Transportation (Non-Medical): No  Physical Activity: Not on file  Stress: Not on file  Social Connections: Not on file  Intimate Partner Violence: Not on file    Family History  Problem Relation Age of Onset   Hypertension Mother    Hypertension Father    Hypertension Brother     Past Surgical History:  Procedure Laterality Date   NO PAST SURGERIES      ROS: Review of Systems Negative except as stated above  PHYSICAL EXAM: BP 113/78 (BP Location: Left Arm, Patient Position: Sitting, Cuff Size: Normal)   Pulse 91   Temp 98.5 F (36.9 C) (Oral)   Ht 6' (1.829 m)   Wt 298 lb (135.2 kg)   SpO2 98%   BMI 40.42 kg/m   Wt Readings from Last 3 Encounters:  10/23/22 298 lb (135.2 kg)  08/09/22 (!) 301 lb 9.6 oz (136.8 kg)  06/22/22 (!) 304 lb (137.9 kg)    Physical Exam  General appearance - alert, well appearing, middle-aged African-American male and in no distress Mental status - normal mood, behavior, speech, dress, motor activity, and thought processes Neck - supple, no significant adenopathy Chest - clear to auscultation, no wheezes, rales or rhonchi, symmetric air entry Heart - normal rate, regular rhythm, normal S1, S2, no murmurs, rubs, clicks or gallops Extremities - peripheral pulses normal, no pedal edema, no clubbing or cyanosis     Latest Ref Rng & Units 06/22/2022    9:39 AM 05/30/2022    9:15 AM 03/31/2022   10:09 PM  CMP  Glucose 70 - 99 mg/dL  97  295   BUN 6 - 24 mg/dL  12  15   Creatinine 6.21 - 1.27 mg/dL  3.08  6.57   Sodium 846 - 144 mmol/L  142  141   Potassium 3.5 - 5.2 mmol/L  4.5  4.2   Chloride 96 - 106 mmol/L  106  109   CO2 20 - 29 mmol/L  20  18   Calcium 8.7 - 10.2 mg/dL  96.2  9.5   Total Protein 6.0 - 8.5 g/dL 7.1   7.1   Total Bilirubin 0.0 - 1.2 mg/dL 0.5   0.3   Alkaline Phos 44 - 121 IU/L 110   83   AST 0 - 40 IU/L 13   17   ALT 0 - 44 IU/L 12   16     Lipid Panel     Component Value Date/Time  CHOL 202 (H) 03/20/2022 1012   TRIG 150 (H) 03/20/2022 1012   HDL 41 03/20/2022 1012   CHOLHDL 4.9 03/20/2022 1012   CHOLHDL 5.2 12/09/2019 0527   VLDL 18 12/09/2019 0527   LDLCALC 134 (H) 03/20/2022 1012    CBC    Component Value Date/Time   WBC 9.4 03/31/2022 2209   RBC 4.71 03/31/2022 2209   HGB 14.7 03/31/2022 2209   HGB 15.6 03/20/2022 1012   HCT 45.4 03/31/2022 2209   HCT 46.9 03/20/2022 1012   PLT 414 (H) 03/31/2022 2209   PLT 460 (H) 03/20/2022 1012   MCV 96.4 03/31/2022 2209   MCV 93 03/20/2022 1012   MCH 31.2 03/31/2022 2209   MCHC 32.4 03/31/2022 2209   RDW 12.0 03/31/2022 2209   RDW 12.0 03/20/2022 1012   LYMPHSABS 2.3 03/31/2022 2209   MONOABS 0.8 03/31/2022 2209   EOSABS 0.4 03/31/2022 2209   BASOSABS 0.1 03/31/2022 2209    ASSESSMENT AND PLAN:  1. Essential hypertension At goal.  Continue amlodipine 10 mg daily and Diovan 160 mg daily.  2. Cardiomyopathy of undetermined type (HCC) He has had improvement in his GFR.  He is asymptomatic in regards to any shortness of breath, dyspnea on exertion, LE edema or chest pains.  He will continue current blood pressure medications listed above.  3. Obesity, Class III, BMI 40-49.9 (morbid obesity) (HCC) Commended him on 6 pound weight loss since last visit. Commended him on dietary changes that he has made so far.  Further dietary counseling given.  4. Mixed hyperlipidemia Continue atorvastatin  5. Tobacco dependence Strongly encouraged him to discontinue smoking as this will help when it comes time for joint replacement surgery.  Patient not willing to quit at this time.  6. Primary osteoarthritis of right knee Will resubmit referral to orthopedics.  Advised him to make sure to keep appointment. - Ambulatory referral to Orthopedics - celecoxib (CELEBREX) 200 MG capsule; Take 1 capsule (200 mg total) by mouth daily as needed.  Dispense: 30 capsule; Refill:  6  7. Screening for colon cancer I have printed the information for [facility] and gave it to him on discharge summary.  Advised that he calls them to schedule his colonoscopy.    Patient was given the opportunity to ask questions.  Patient verbalized understanding of the plan and was able to repeat key elements of the plan.   This documentation was completed using Paediatric nurse.  Any transcriptional errors are unintentional.  Orders Placed This Encounter  Procedures   Ambulatory referral to Orthopedics     Requested Prescriptions   Signed Prescriptions Disp Refills   celecoxib (CELEBREX) 200 MG capsule 30 capsule 6    Sig: Take 1 capsule (200 mg total) by mouth daily as needed.    Return in about 4 months (around 02/23/2023).  Jonah Blue, MD, FACP

## 2022-10-25 ENCOUNTER — Ambulatory Visit: Payer: BLUE CROSS/BLUE SHIELD | Admitting: Orthopedic Surgery

## 2022-11-01 ENCOUNTER — Ambulatory Visit: Payer: BLUE CROSS/BLUE SHIELD | Admitting: Orthopaedic Surgery

## 2022-11-08 ENCOUNTER — Other Ambulatory Visit: Payer: Self-pay

## 2022-11-08 ENCOUNTER — Ambulatory Visit: Payer: BLUE CROSS/BLUE SHIELD | Admitting: Orthopaedic Surgery

## 2022-11-15 ENCOUNTER — Ambulatory Visit: Payer: BLUE CROSS/BLUE SHIELD | Admitting: Orthopaedic Surgery

## 2022-11-16 ENCOUNTER — Other Ambulatory Visit: Payer: Self-pay

## 2022-12-06 ENCOUNTER — Other Ambulatory Visit: Payer: Self-pay

## 2022-12-14 ENCOUNTER — Other Ambulatory Visit: Payer: Self-pay

## 2023-01-03 ENCOUNTER — Other Ambulatory Visit (HOSPITAL_COMMUNITY): Payer: Self-pay

## 2023-01-03 ENCOUNTER — Other Ambulatory Visit: Payer: Self-pay

## 2023-01-05 ENCOUNTER — Other Ambulatory Visit: Payer: Self-pay

## 2023-01-30 ENCOUNTER — Other Ambulatory Visit (HOSPITAL_COMMUNITY): Payer: Self-pay

## 2023-01-30 ENCOUNTER — Other Ambulatory Visit: Payer: Self-pay

## 2023-01-30 ENCOUNTER — Other Ambulatory Visit: Payer: Self-pay | Admitting: Internal Medicine

## 2023-01-30 DIAGNOSIS — I10 Essential (primary) hypertension: Secondary | ICD-10-CM

## 2023-01-30 MED ORDER — AMLODIPINE BESYLATE 10 MG PO TABS
10.0000 mg | ORAL_TABLET | Freq: Every day | ORAL | 0 refills | Status: DC
Start: 2023-01-30 — End: 2023-04-04
  Filled 2023-01-30 – 2023-03-08 (×3): qty 30, 30d supply, fill #0

## 2023-01-31 ENCOUNTER — Other Ambulatory Visit: Payer: Self-pay

## 2023-02-26 ENCOUNTER — Ambulatory Visit: Payer: BLUE CROSS/BLUE SHIELD | Admitting: Internal Medicine

## 2023-03-05 ENCOUNTER — Other Ambulatory Visit: Payer: Self-pay | Admitting: Internal Medicine

## 2023-03-05 ENCOUNTER — Other Ambulatory Visit (HOSPITAL_COMMUNITY): Payer: Self-pay

## 2023-03-05 DIAGNOSIS — I10 Essential (primary) hypertension: Secondary | ICD-10-CM

## 2023-03-06 ENCOUNTER — Other Ambulatory Visit (HOSPITAL_COMMUNITY): Payer: Self-pay

## 2023-03-06 ENCOUNTER — Other Ambulatory Visit: Payer: Self-pay | Admitting: Internal Medicine

## 2023-03-06 DIAGNOSIS — I10 Essential (primary) hypertension: Secondary | ICD-10-CM

## 2023-03-06 MED ORDER — VALSARTAN 160 MG PO TABS
160.0000 mg | ORAL_TABLET | Freq: Every day | ORAL | 0 refills | Status: DC
Start: 2023-03-06 — End: 2023-04-05
  Filled 2023-03-06 – 2023-03-08 (×2): qty 90, 90d supply, fill #0

## 2023-03-06 MED ORDER — ATORVASTATIN CALCIUM 10 MG PO TABS
10.0000 mg | ORAL_TABLET | Freq: Every day | ORAL | 0 refills | Status: DC
Start: 2023-03-06 — End: 2023-04-05
  Filled 2023-03-06 – 2023-03-08 (×2): qty 90, 90d supply, fill #0

## 2023-03-06 NOTE — Telephone Encounter (Signed)
Requested Prescriptions  Pending Prescriptions Disp Refills   atorvastatin (LIPITOR) 10 MG tablet 90 tablet 0    Sig: Take 1 tablet (10 mg total) by mouth daily.     Cardiovascular:  Antilipid - Statins Failed - 03/06/2023  3:57 PM      Failed - Lipid Panel in normal range within the last 12 months    Cholesterol, Total  Date Value Ref Range Status  03/20/2022 202 (H) 100 - 199 mg/dL Final   LDL Chol Calc (NIH)  Date Value Ref Range Status  03/20/2022 134 (H) 0 - 99 mg/dL Final   HDL  Date Value Ref Range Status  03/20/2022 41 >39 mg/dL Final   Triglycerides  Date Value Ref Range Status  03/20/2022 150 (H) 0 - 149 mg/dL Final         Passed - Patient is not pregnant      Passed - Valid encounter within last 12 months    Recent Outpatient Visits           4 months ago Essential hypertension   [place] [facility] Marcine Matar, MD   8 months ago Essential hypertension   [place] [facility] Marcine Matar, MD   9 months ago Essential hypertension   [facility] [place], Cornelius Moras, RPH-CPP   10 months ago Essential hypertension   [facility] [place], Cornelius Moras, RPH-CPP   11 months ago Establishing care with new doctor, encounter for   [facility] Jonah Blue B, MD               valsartan (DIOVAN) 160 MG tablet 90 tablet 0    Sig: Take 1 tablet (160 mg total) by mouth daily.     Cardiovascular:  Angiotensin Receptor Blockers Failed - 03/06/2023  3:57 PM      Failed - Cr in normal range and within 180 days    Creatinine, Ser  Date Value Ref Range Status  05/30/2022 1.05 0.76 - 1.27 mg/dL Final         Failed - K in normal range and within 180 days    Potassium  Date Value Ref Range Status  05/30/2022 4.5 3.5 - 5.2 mmol/L Final         Passed - Patient is not pregnant      Passed -  Last BP in normal range    BP Readings from Last 1 Encounters:  10/23/22 113/78         Passed - Valid encounter within last 6 months    Recent Outpatient Visits           4 months ago Essential hypertension   [place] [facility] Marcine Matar, MD   8 months ago Essential hypertension   [place] [facility] Marcine Matar, MD   9 months ago Essential hypertension   [facility] [place], Cornelius Moras, RPH-CPP   10 months ago Essential hypertension   [facility] [place], Cornelius Moras, RPH-CPP   11 months ago Establishing care with new doctor, encounter for   [facility] Marcine Matar, MD

## 2023-03-06 NOTE — Telephone Encounter (Signed)
Requested medication (s) are due for refill today:   Yes  Requested medication (s) are on the active medication list:   Yes  Future visit scheduled:   No   Last ordered: Both 06/22/2022 #30, 6 refills  Unable to refill because labs are due for the Valsartan.     Requested Prescriptions  Pending Prescriptions Disp Refills   atorvastatin (LIPITOR) 10 MG tablet 30 tablet 6    Sig: Take 1 tablet (10 mg total) by mouth daily.     Cardiovascular:  Antilipid - Statins Failed - 03/05/2023  9:52 AM      Failed - Lipid Panel in normal range within the last 12 months    Cholesterol, Total  Date Value Ref Range Status  03/20/2022 202 (H) 100 - 199 mg/dL Final   LDL Chol Calc (NIH)  Date Value Ref Range Status  03/20/2022 134 (H) 0 - 99 mg/dL Final   HDL  Date Value Ref Range Status  03/20/2022 41 >39 mg/dL Final   Triglycerides  Date Value Ref Range Status  03/20/2022 150 (H) 0 - 149 mg/dL Final         Passed - Patient is not pregnant      Passed - Valid encounter within last 12 months    Recent Outpatient Visits           4 months ago Essential hypertension   [place] [facility] Marcine Matar, MD   8 months ago Essential hypertension   [place] [facility] [place] [facility] Marcine Matar, MD   9 months ago Essential hypertension   [facility] [place], Cornelius Moras, RPH-CPP   10 months ago Essential hypertension   [facility] [place], Cornelius Moras, RPH-CPP   11 months ago Establishing care with new doctor, encounter for   [facility] Jonah Blue B, MD               valsartan (DIOVAN) 160 MG tablet 30 tablet 6    Sig: Take 1 tablet (160 mg total) by mouth daily.     Cardiovascular:  Angiotensin Receptor Blockers Failed - 03/05/2023  9:52 AM      Failed - Cr in normal range and within 180 days    Creatinine, Ser   Date Value Ref Range Status  05/30/2022 1.05 0.76 - 1.27 mg/dL Final         Failed - K in normal range and within 180 days    Potassium  Date Value Ref Range Status  05/30/2022 4.5 3.5 - 5.2 mmol/L Final         Passed - Patient is not pregnant      Passed - Last BP in normal range    BP Readings from Last 1 Encounters:  10/23/22 113/78         Passed - Valid encounter within last 6 months    Recent Outpatient Visits           4 months ago Essential hypertension   [place] [facility] Marcine Matar, MD   8 months ago Essential hypertension   [place] [facility] Marcine Matar, MD   9 months ago Essential hypertension   [place] [facility] [place], [place] L, RPH-CPP   10 months ago Essential hypertension   [place]  [facility] [place], Cornelius Moras, RPH-CPP   11 months ago Establishing care with new doctor, encounter for   [facility] Marcine Matar, MD

## 2023-03-08 ENCOUNTER — Other Ambulatory Visit (HOSPITAL_COMMUNITY): Payer: Self-pay

## 2023-03-08 ENCOUNTER — Other Ambulatory Visit: Payer: Self-pay

## 2023-04-04 ENCOUNTER — Other Ambulatory Visit (HOSPITAL_COMMUNITY): Payer: Self-pay

## 2023-04-04 ENCOUNTER — Other Ambulatory Visit: Payer: Self-pay | Admitting: Internal Medicine

## 2023-04-04 DIAGNOSIS — I10 Essential (primary) hypertension: Secondary | ICD-10-CM

## 2023-04-04 MED ORDER — AMLODIPINE BESYLATE 10 MG PO TABS
10.0000 mg | ORAL_TABLET | Freq: Every day | ORAL | 0 refills | Status: DC
  Filled 2023-04-04: qty 30, 30d supply, fill #0

## 2023-04-05 ENCOUNTER — Encounter: Payer: Self-pay | Admitting: Internal Medicine

## 2023-04-05 ENCOUNTER — Other Ambulatory Visit: Payer: Self-pay

## 2023-04-05 ENCOUNTER — Ambulatory Visit: Payer: BLUE CROSS/BLUE SHIELD | Attending: Internal Medicine | Admitting: Internal Medicine

## 2023-04-05 VITALS — BP 131/81 | HR 104 | Ht 72.0 in | Wt 288.0 lb

## 2023-04-05 DIAGNOSIS — K5900 Constipation, unspecified: Secondary | ICD-10-CM

## 2023-04-05 DIAGNOSIS — E782 Mixed hyperlipidemia: Secondary | ICD-10-CM

## 2023-04-05 DIAGNOSIS — I10 Essential (primary) hypertension: Secondary | ICD-10-CM | POA: Diagnosis not present

## 2023-04-05 DIAGNOSIS — F17200 Nicotine dependence, unspecified, uncomplicated: Secondary | ICD-10-CM

## 2023-04-05 DIAGNOSIS — K625 Hemorrhage of anus and rectum: Secondary | ICD-10-CM | POA: Diagnosis not present

## 2023-04-05 DIAGNOSIS — M1711 Unilateral primary osteoarthritis, right knee: Secondary | ICD-10-CM | POA: Diagnosis not present

## 2023-04-05 MED ORDER — AMLODIPINE BESYLATE 10 MG PO TABS
10.0000 mg | ORAL_TABLET | Freq: Every day | ORAL | 1 refills | Status: DC
  Filled 2023-04-05 (×2): qty 90, 90d supply, fill #0
  Filled 2023-04-30: qty 90, 90d supply, fill #1

## 2023-04-05 MED ORDER — VALSARTAN 160 MG PO TABS
160.0000 mg | ORAL_TABLET | Freq: Every day | ORAL | 1 refills | Status: DC
  Filled 2023-04-05 – 2023-04-30 (×2): qty 90, 90d supply, fill #0

## 2023-04-05 MED ORDER — POLYETHYLENE GLYCOL 3350 17 GM/SCOOP PO POWD
17.0000 g | Freq: Every day | ORAL | 1 refills | Status: DC | PRN
Start: 2023-04-05 — End: 2023-08-09
  Filled 2023-04-05: qty 510, 30d supply, fill #0

## 2023-04-05 MED ORDER — CELECOXIB 200 MG PO CAPS
200.0000 mg | ORAL_CAPSULE | Freq: Every day | ORAL | 1 refills | Status: DC | PRN
  Filled 2023-04-05: qty 90, 90d supply, fill #0
  Filled 2023-04-30: qty 90, 90d supply, fill #1

## 2023-04-05 MED ORDER — HYDROCORTISONE (PERIANAL) 2.5 % EX CREA
TOPICAL_CREAM | CUTANEOUS | 1 refills | Status: DC
  Filled 2023-04-05: qty 30, 30d supply, fill #0
  Filled 2023-04-30: qty 30, 30d supply, fill #1

## 2023-04-05 MED ORDER — ATORVASTATIN CALCIUM 10 MG PO TABS
10.0000 mg | ORAL_TABLET | Freq: Every day | ORAL | 1 refills | Status: DC
  Filled 2023-04-05: qty 90, 90d supply, fill #0

## 2023-04-05 NOTE — Patient Instructions (Signed)
Call [facility] to schedule appointment for colonoscopy:  [address] [place], [place] [address] PH# [phone]

## 2023-04-05 NOTE — Progress Notes (Signed)
Patient ID: Manuel Kosta., male    DOB: 1970-07-15  MRN: [id number]  CC: Hypertension (HTN f/u. Med refill. /Reports soreness, fatigue, lack of energy /No to flu vax. No to shingles vax)   Subjective: Manuel Welch is a 52 y.o. male who presents for chronic ds management. His concerns today include:  Patient with history of HTN, obesity,CM with a EF of 30 to 35%; increased to 45-50% on echo 07/2022, tob dep, cocaine use, OA knee   Patient brings his medication bottles with him today.  HTN/CM: No chest pains or shortness of breath.  No swelling in the lower legs. Should be on Norvasc 10 mg daily and Diovan 160 mg daily.  Has bottles with him.  Reports he is taking consistently.  Body feels drain after he gets off work.  Works for the city [employer].  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 Sleeping well at nights.  No dizziness.  Wonders if he needs vitamins  HL: Reports compliance with taking and tolerating atorvastatin 10 mg   Tobacco dependence: Still smoking and not ready to quit  C/o pain with BM and blood in stools x several mths.  Endorses constipation. Referred to [facility] this yr for c-scope but they were unable to reach him.  I gave him their info on last visit so he could call them but he does not recall.  Request refill on meloxicam for the arthritis in the right knee. Patient Active Problem List   Diagnosis Date Noted   Mixed hyperlipidemia 06/22/2022   Primary osteoarthritis of right knee 03/20/2022   Encounter to establish care 03/11/2020   Essential hypertension 03/11/2020   Tobacco dependence 03/11/2020   Cardiomyopathy (HCC)    Cocaine use    Obesity, Class III, BMI 40-49.9 (morbid obesity) (HCC)    Multifocal pneumonia 12/09/2019   Atypical pneumonia 12/08/2019   Chest pain 12/08/2019   Accelerated hypertension    Elevated troponin    Tobacco abuse      Current Outpatient Medications on File Prior to Visit  Medication Sig Dispense  Refill   amLODipine (NORVASC) 10 MG tablet Take 1 tablet (10 mg total) by mouth daily. 30 tablet 0   atorvastatin (LIPITOR) 10 MG tablet Take 1 tablet (10 mg total) by mouth daily. 90 tablet 0   celecoxib (CELEBREX) 200 MG capsule Take 1 capsule (200 mg total) by mouth daily as needed. 30 capsule 6   valsartan (DIOVAN) 160 MG tablet Take 1 tablet (160 mg total) by mouth daily. 90 tablet 0   No current facility-administered medications on file prior to visit.    Allergies  Allergen Reactions   Bee Venom     Social History   Socioeconomic History   Marital status: Single    Spouse name: Not on file   Number of children: Not on file   Years of education: Not on file   Highest education level: Not on file  Occupational History   Not on file  Tobacco Use   Smoking status: Every Day    Current packs/day: 0.25    Average packs/day: 0.3 packs/day for 29.0 years (7.3 ttl pk-yrs)    Types: Cigarettes   Smokeless tobacco: Never  Vaping Use   Vaping status: Never Used  Substance and Sexual Activity   Alcohol use: Yes   Drug use: No   Sexual activity: Yes  Other Topics Concern   Not on file  Social History Narrative   Lives  at home with mother.   Social Determinants of Health   Financial Resource Strain: Not on file  Food Insecurity: No Food Insecurity (05/05/2021)   Hunger Vital Sign    Worried About Running Out of Food in the Last Year: Never true    Ran Out of Food in the Last Year: Never true  Transportation Needs: No Transportation Needs (05/05/2021)   PRAPARE - Administrator, Civil Service (Medical): No    Lack of Transportation (Non-Medical): No  Physical Activity: Not on file  Stress: Not on file  Social Connections: Not on file  Intimate Partner Violence: Not on file    Family History  Problem Relation Age of Onset   Hypertension Mother    Hypertension Father    Hypertension Brother     Past Surgical History:  Procedure Laterality Date   NO  PAST SURGERIES      ROS: Review of Systems Negative except as stated above  PHYSICAL EXAM: BP 131/81 (BP Location: Left Arm, Patient Position: Sitting, Cuff Size: Large)   Pulse (!) 104   Ht 6' (1.829 m)   Wt 288 lb (130.6 kg)   SpO2 97%   BMI 39.06 kg/m   Wt Readings from Last 3 Encounters:  04/05/23 288 lb (130.6 kg)  10/23/22 298 lb (135.2 kg)  08/09/22 (!) 301 lb 9.6 oz (136.8 kg)    Physical Exam   General appearance - alert, well appearing, and in no distress Mental status - normal mood, behavior, speech, dress, motor activity, and thought processes Neck - supple, no significant adenopathy Chest - clear to auscultation, no wheezes, rales or rhonchi, symmetric air entry Heart - normal rate, regular rhythm, normal S1, S2, no murmurs, rubs, clicks or gallops Rectal: Patient declined rectal exam. Musculoskeletal -right knee: Mild enlargement of the joint compared to the left.  Mild discomfort on passive range of motion. Extremities - peripheral pulses normal, no pedal edema, no clubbing or cyanosis     Latest Ref Rng & Units 06/22/2022    9:39 AM 05/30/2022    9:15 AM 03/31/2022   10:09 PM  CMP  Glucose 70 - 99 mg/dL  97  409   BUN 6 - 24 mg/dL  12  15   Creatinine 8.11 - 1.27 mg/dL  9.14  7.82   Sodium 956 - 144 mmol/L  142  141   Potassium 3.5 - 5.2 mmol/L  4.5  4.2   Chloride 96 - 106 mmol/L  106  109   CO2 20 - 29 mmol/L  20  18   Calcium 8.7 - 10.2 mg/dL  21.3  9.5   Total Protein 6.0 - 8.5 g/dL 7.1   7.1   Total Bilirubin 0.0 - 1.2 mg/dL 0.5   0.3   Alkaline Phos 44 - 121 IU/L 110   83   AST 0 - 40 IU/L 13   17   ALT 0 - 44 IU/L 12   16    Lipid Panel     Component Value Date/Time   CHOL 202 (H) 03/20/2022 1012   TRIG 150 (H) 03/20/2022 1012   HDL 41 03/20/2022 1012   CHOLHDL 4.9 03/20/2022 1012   CHOLHDL 5.2 12/09/2019 0527   VLDL 18 12/09/2019 0527   LDLCALC 134 (H) 03/20/2022 1012    CBC    Component Value Date/Time   WBC 9.4 03/31/2022  2209   RBC 4.71 03/31/2022 2209   HGB 14.7 03/31/2022 2209   HGB  15.6 03/20/2022 1012   HCT 45.4 03/31/2022 2209   HCT 46.9 03/20/2022 1012   PLT 414 (H) 03/31/2022 2209   PLT 460 (H) 03/20/2022 1012   MCV 96.4 03/31/2022 2209   MCV 93 03/20/2022 1012   MCH 31.2 03/31/2022 2209   MCHC 32.4 03/31/2022 2209   RDW 12.0 03/31/2022 2209   RDW 12.0 03/20/2022 1012   LYMPHSABS 2.3 03/31/2022 2209   MONOABS 0.8 03/31/2022 2209   EOSABS 0.4 03/31/2022 2209   BASOSABS 0.1 03/31/2022 2209    ASSESSMENT AND PLAN: 1. Essential hypertension Close to goal.  He will continue amlodipine 10 mg daily and valsartan 160 milligrams daily. - amLODipine (NORVASC) 10 MG tablet; Take 1 tablet (10 mg total) by mouth daily.  Dispense: 90 tablet; Refill: 1 - atorvastatin (LIPITOR) 10 MG tablet; Take 1 tablet (10 mg total) by mouth daily.  Dispense: 90 tablet; Refill: 1 - valsartan (DIOVAN) 160 MG tablet; Take 1 tablet (160 mg total) by mouth daily.  Dispense: 90 tablet; Refill: 1  2. Rectal bleeding He may have fibroids given that he has been having constipation as well.  He declines rectal exam today.  Encouraged him to increase fiber in the diet by eating more green leafy vegetables.  Prescribed MiraLAX to use as needed.  Encourage sitz bath's.  Anusol cream given to use around the rectal area.  I have resubmitted referral to the gastroenterologist.  I have also written down the number for [facility]ology so that he can call them to schedule the appointment for colonoscopy. - Ambulatory referral to Gastroenterology - hydrocortisone (ANUSOL-HC) 2.5 % rectal cream; Apply to rectal area daily as needed for rectal pain  Dispense: 30 g; Refill: 1  3. Constipation, unspecified constipation type See #2 above - Ambulatory referral to Gastroenterology - polyethylene glycol powder (GLYCOLAX/MIRALAX) 17 GM/SCOOP powder; Mix 1 capful (17 g) in 8 ounces of water/juice by mouth daily as needed.  Dispense: 3350 g;  Refill: 1  4. Primary osteoarthritis of right knee - celecoxib (CELEBREX) 200 MG capsule; Take 1 capsule (200 mg total) by mouth daily as needed.  Dispense: 90 capsule; Refill: 1  5. Tobacco dependence Strongly advised to quit.  Patient not ready to give a trial of quitting.  6. Mixed hyperlipidemia Continue atorvastatin.  Due for blood test today including CBC, chemistry and lipid profile.  Patient declined doing any blood test today stating that he we will do it on his next visit.   Patient was given the opportunity to ask questions.  Patient verbalized understanding of the plan and was able to repeat key elements of the plan.   This documentation was completed using Paediatric nurse.  Any transcriptional errors are unintentional.  No orders of the defined types were placed in this encounter.    Requested Prescriptions   Pending Prescriptions Disp Refills   amLODipine (NORVASC) 10 MG tablet 90 tablet 1    Sig: Take 1 tablet (10 mg total) by mouth daily.   atorvastatin (LIPITOR) 10 MG tablet 90 tablet 1    Sig: Take 1 tablet (10 mg total) by mouth daily.   valsartan (DIOVAN) 160 MG tablet 90 tablet 1    Sig: Take 1 tablet (160 mg total) by mouth daily.   celecoxib (CELEBREX) 200 MG capsule 90 capsule 1    Sig: Take 1 capsule (200 mg total) by mouth daily as needed.    No follow-ups on file.  Jonah Blue, MD, FACP

## 2023-04-06 ENCOUNTER — Other Ambulatory Visit (HOSPITAL_COMMUNITY): Payer: Self-pay

## 2023-04-30 ENCOUNTER — Other Ambulatory Visit (HOSPITAL_COMMUNITY): Payer: Self-pay

## 2023-08-09 ENCOUNTER — Other Ambulatory Visit: Payer: Self-pay

## 2023-08-09 ENCOUNTER — Ambulatory Visit: Payer: BLUE CROSS/BLUE SHIELD | Attending: Internal Medicine | Admitting: Internal Medicine

## 2023-08-09 ENCOUNTER — Encounter: Payer: Self-pay | Admitting: Internal Medicine

## 2023-08-09 VITALS — BP 120/78 | HR 109 | Temp 98.8°F | Ht 72.0 in | Wt 289.0 lb

## 2023-08-09 DIAGNOSIS — E66812 Obesity, class 2: Secondary | ICD-10-CM

## 2023-08-09 DIAGNOSIS — M1712 Unilateral primary osteoarthritis, left knee: Secondary | ICD-10-CM

## 2023-08-09 DIAGNOSIS — I10 Essential (primary) hypertension: Secondary | ICD-10-CM | POA: Diagnosis not present

## 2023-08-09 DIAGNOSIS — Z2821 Immunization not carried out because of patient refusal: Secondary | ICD-10-CM

## 2023-08-09 DIAGNOSIS — K625 Hemorrhage of anus and rectum: Secondary | ICD-10-CM

## 2023-08-09 DIAGNOSIS — Z6839 Body mass index (BMI) 39.0-39.9, adult: Secondary | ICD-10-CM

## 2023-08-09 DIAGNOSIS — I429 Cardiomyopathy, unspecified: Secondary | ICD-10-CM

## 2023-08-09 DIAGNOSIS — F17200 Nicotine dependence, unspecified, uncomplicated: Secondary | ICD-10-CM

## 2023-08-09 DIAGNOSIS — Z1211 Encounter for screening for malignant neoplasm of colon: Secondary | ICD-10-CM

## 2023-08-09 DIAGNOSIS — F17210 Nicotine dependence, cigarettes, uncomplicated: Secondary | ICD-10-CM | POA: Diagnosis not present

## 2023-08-09 DIAGNOSIS — K5900 Constipation, unspecified: Secondary | ICD-10-CM

## 2023-08-09 DIAGNOSIS — E782 Mixed hyperlipidemia: Secondary | ICD-10-CM

## 2023-08-09 MED ORDER — VALSARTAN 40 MG PO TABS
40.0000 mg | ORAL_TABLET | Freq: Every day | ORAL | 1 refills | Status: DC
  Filled 2023-08-09 (×2): qty 30, 30d supply, fill #0
  Filled 2023-09-17: qty 30, 30d supply, fill #1
  Filled 2023-10-29: qty 30, 30d supply, fill #2
  Filled 2023-11-28 (×2): qty 30, 30d supply, fill #3

## 2023-08-09 MED ORDER — POLYETHYLENE GLYCOL 3350 17 GM/SCOOP PO POWD
17.0000 g | Freq: Every day | ORAL | 1 refills | Status: DC | PRN
  Filled 2023-08-09: qty 510, 30d supply, fill #0
  Filled 2023-09-17: qty 510, 30d supply, fill #1

## 2023-08-09 MED ORDER — ATORVASTATIN CALCIUM 10 MG PO TABS
10.0000 mg | ORAL_TABLET | Freq: Every day | ORAL | 1 refills | Status: DC
  Filled 2023-08-09: qty 30, 30d supply, fill #0
  Filled 2023-09-17: qty 30, 30d supply, fill #1
  Filled 2023-10-29: qty 30, 30d supply, fill #2
  Filled 2023-11-28 (×2): qty 30, 30d supply, fill #3

## 2023-08-09 MED ORDER — CELECOXIB 200 MG PO CAPS
200.0000 mg | ORAL_CAPSULE | Freq: Every day | ORAL | 1 refills | Status: DC | PRN
  Filled 2023-08-09: qty 30, 30d supply, fill #0
  Filled 2023-09-17: qty 30, 30d supply, fill #1
  Filled 2023-10-29: qty 30, 30d supply, fill #2
  Filled 2023-11-28 (×2): qty 30, 30d supply, fill #3

## 2023-08-09 NOTE — Progress Notes (Signed)
Patient ID: Manuel Welch., male    DOB: 1971-03-13  MRN: [id number]  CC: Hypertension (HTN f/u. Med refill. /L knee pain X3 weeks/Yes to pneumonia vax. No to shingles vax.)   Subjective: Manuel Welch is a 53 y.o. male who presents for chronic ds management. His concerns today include:  Patient with history of HTN, obesity,CM with a EF of 30 to 35%; increased to 45-50% on echo 07/2022, tob dep, cocaine use, OA knee   OA: has known OA RT knee but now LT knee started giving him problems last mth. Hurts when he bends the knee. No swelling. No Injury Using [facility] OTC which did not help. Thinks he knees knee brace Out of Celebrex x 2 wks. had seen sports medicine last year for the right knee and had injection with good results.  HTN/CM: Should be on Norvasc 10 mg daily and Diovan 160 mg daily.  Has empty bottles with him.  He tells me he has been out of them for 2 weeks as well. No CP/SOB/LE edema or palpitations  HL: Reports compliance with taking and tolerating atorvastatin 10 mg but out x 2 wks  Still smoking and not ready to quit.  Referred to GI on last visit for rectal bleeding on last visit; sent to [facility].  Pt said he does not recall being called.  Reports that stools are hard if he does not take the MiraLAX.  Obesity:  Drinks Kool-Aide, beer, water.  Eating a lot of white carbs.  Likes white bread.  Weight is stable.  Patient Active Problem List   Diagnosis Date Noted   Mixed hyperlipidemia 06/22/2022   Primary osteoarthritis of right knee 03/20/2022   Encounter to establish care 03/11/2020   Essential hypertension 03/11/2020   Tobacco dependence 03/11/2020   Cardiomyopathy (HCC)    Cocaine use    Obesity, Class III, BMI 40-49.9 (morbid obesity) (HCC)    Multifocal pneumonia 12/09/2019   Atypical pneumonia 12/08/2019   Chest pain 12/08/2019   Accelerated hypertension    Elevated troponin    Tobacco abuse      Current Outpatient Medications on File Prior to Visit   Medication Sig Dispense Refill   hydrocortisone (ANUSOL-HC) 2.5 % rectal cream Apply to rectal area daily as needed for rectal pain 30 g 1   No current facility-administered medications on file prior to visit.    Allergies  Allergen Reactions   Bee Venom     Social History   Socioeconomic History   Marital status: Single    Spouse name: Not on file   Number of children: Not on file   Years of education: Not on file   Highest education level: Not on file  Occupational History   Not on file  Tobacco Use   Smoking status: Every Day    Current packs/day: 0.25    Average packs/day: 0.3 packs/day for 29.0 years (7.3 ttl pk-yrs)    Types: Cigarettes   Smokeless tobacco: Never  Vaping Use   Vaping status: Never Used  Substance and Sexual Activity   Alcohol use: Yes   Drug use: No   Sexual activity: Yes  Other Topics Concern   Not on file  Social History Narrative   Lives at home with mother.   Social Drivers of Corporate investment banker Strain: Not on file  Food Insecurity: No Food Insecurity (05/05/2021)   Hunger Vital Sign    Worried About Running Out of Food in the Last  Year: Never true    Ran Out of Food in the Last Year: Never true  Transportation Needs: No Transportation Needs (05/05/2021)   PRAPARE - Administrator, Civil Service (Medical): No    Lack of Transportation (Non-Medical): No  Physical Activity: Not on file  Stress: Not on file  Social Connections: Not on file  Intimate Partner Violence: Not on file    Family History  Problem Relation Age of Onset   Hypertension Mother    Hypertension Father    Hypertension Brother     Past Surgical History:  Procedure Laterality Date   NO PAST SURGERIES      ROS: Review of Systems Negative except as stated above  PHYSICAL EXAM: BP 120/78 (BP Location: Left Arm, Patient Position: Sitting, Cuff Size: Large)   Pulse (!) 109   Temp 98.8 F (37.1 C) (Oral)   Ht 6' (1.829 m)   Wt 289 lb  (131.1 kg)   SpO2 97%   BMI 39.20 kg/m   Wt Readings from Last 3 Encounters:  08/09/23 289 lb (131.1 kg)  04/05/23 288 lb (130.6 kg)  10/23/22 298 lb (135.2 kg)    Physical Exam  General appearance - alert, well appearing, and in no distress Mental status -patient answers questions appropriately. Chest - clear to auscultation, no wheezes, rales or rhonchi, symmetric air entry Heart - normal rate, regular rhythm, normal S1, S2, no murmurs, rubs, clicks or gallops Musculoskeletal -left knee: No edema or erythema.  No point tenderness.  Mild crepitus on passive range of motion Extremities - peripheral pulses normal, no pedal edema, no clubbing or cyanosis     Latest Ref Rng & Units 06/22/2022    9:39 AM 05/30/2022    9:15 AM 03/31/2022   10:09 PM  CMP  Glucose 70 - 99 mg/dL  97  161   BUN 6 - 24 mg/dL  12  15   Creatinine 0.96 - 1.27 mg/dL  0.45  4.09   Sodium 811 - 144 mmol/L  142  141   Potassium 3.5 - 5.2 mmol/L  4.5  4.2   Chloride 96 - 106 mmol/L  106  109   CO2 20 - 29 mmol/L  20  18   Calcium 8.7 - 10.2 mg/dL  91.4  9.5   Total Protein 6.0 - 8.5 g/dL 7.1   7.1   Total Bilirubin 0.0 - 1.2 mg/dL 0.5   0.3   Alkaline Phos 44 - 121 IU/L 110   83   AST 0 - 40 IU/L 13   17   ALT 0 - 44 IU/L 12   16    Lipid Panel     Component Value Date/Time   CHOL 202 (H) 03/20/2022 1012   TRIG 150 (H) 03/20/2022 1012   HDL 41 03/20/2022 1012   CHOLHDL 4.9 03/20/2022 1012   CHOLHDL 5.2 12/09/2019 0527   VLDL 18 12/09/2019 0527   LDLCALC 134 (H) 03/20/2022 1012    CBC    Component Value Date/Time   WBC 9.4 03/31/2022 2209   RBC 4.71 03/31/2022 2209   HGB 14.7 03/31/2022 2209   HGB 15.6 03/20/2022 1012   HCT 45.4 03/31/2022 2209   HCT 46.9 03/20/2022 1012   PLT 414 (H) 03/31/2022 2209   PLT 460 (H) 03/20/2022 1012   MCV 96.4 03/31/2022 2209   MCV 93 03/20/2022 1012   MCH 31.2 03/31/2022 2209   MCHC 32.4 03/31/2022 2209   RDW 12.0 03/31/2022  2209   RDW 12.0 03/20/2022  1012   LYMPHSABS 2.3 03/31/2022 2209   MONOABS 0.8 03/31/2022 2209   EOSABS 0.4 03/31/2022 2209   BASOSABS 0.1 03/31/2022 2209    ASSESSMENT AND PLAN: 1. Essential hypertension (Primary) At goal considering he has been off both Diovan and Norvasc for 2 weeks.  Will stop Norvasc.  Given he has cardiomyopathy, we will keep the Diovan but significantly decrease the dose to 40 mg daily. - atorvastatin (LIPITOR) 10 MG tablet; Take 1 tablet (10 mg total) by mouth daily.  Dispense: 90 tablet; Refill: 1 - valsartan (DIOVAN) 40 MG tablet; Take 1 tablet (40 mg total) by mouth daily.  Dispense: 90 tablet; Refill: 1 - CBC - Comprehensive metabolic panel  2. Cardiomyopathy of undetermined type (HCC) See #1 above.  3. Primary osteoarthritis of left knee Refill given on Celebrex.  Encouraged weight loss.  I do not see the need for a knee brace at this time - celecoxib (CELEBREX) 200 MG capsule; Take 1 capsule (200 mg total) by mouth daily as needed.  Dispense: 90 capsule; Refill: 1 - AMB referral to orthopedics  4. Tobacco dependence Advised to quit.  Patient not ready to give a trial of quitting.  5. Mixed hyperlipidemia Continue atorvastatin 10 mg daily.  He will stop at the lab today for blood test - Lipid panel  6. Constipation, unspecified constipation type Continue MiraLAX.  Increase fiber in the diet like green leafy vegetables. - polyethylene glycol powder (GLYCOLAX/MIRALAX) 17 GM/SCOOP powder; Mix 1 capful (17 g) in 8 ounces of water/juice by mouth daily as needed.  Dispense: 3350 g; Refill: 1  7. Rectal bleeding We will resubmit referral 1 more time to the gastroenterologist for colonoscopy. - Ambulatory referral to Gastroenterology  8. Class 2 severe obesity due to excess calories with serious comorbidity and body mass index (BMI) of 39.0 to 39.9 in adult [facility]) Encouraged him to eliminate sugary drinks from the diet and cut back on portion sizes of white carbs.  Eat wheat bread  instead of white bread.  9. Screening for colon cancer - Ambulatory referral to Gastroenterology  10. Pneumococcal vaccination declined  11. Herpes zoster vaccination declined  Patient was given the opportunity to ask questions.  Patient verbalized understanding of the plan and was able to repeat key elements of the plan.   This documentation was completed using Paediatric nurse.  Any transcriptional errors are unintentional.  Orders Placed This Encounter  Procedures   CBC   Comprehensive metabolic panel   Lipid panel   AMB referral to orthopedics   Ambulatory referral to Gastroenterology     Requested Prescriptions   Signed Prescriptions Disp Refills   atorvastatin (LIPITOR) 10 MG tablet 90 tablet 1    Sig: Take 1 tablet (10 mg total) by mouth daily.   valsartan (DIOVAN) 40 MG tablet 90 tablet 1    Sig: Take 1 tablet (40 mg total) by mouth daily.   celecoxib (CELEBREX) 200 MG capsule 90 capsule 1    Sig: Take 1 capsule (200 mg total) by mouth daily as needed.   polyethylene glycol powder (GLYCOLAX/MIRALAX) 17 GM/SCOOP powder 3350 g 1    Sig: Mix 1 capful (17 g) in 8 ounces of water/juice by mouth daily as needed.    Return in about 4 months (around 12/07/2023).  Jonah Blue, MD, FACP

## 2023-08-10 ENCOUNTER — Telehealth: Payer: Self-pay

## 2023-08-10 ENCOUNTER — Other Ambulatory Visit: Payer: Self-pay

## 2023-08-10 DIAGNOSIS — D75839 Thrombocytosis, unspecified: Secondary | ICD-10-CM

## 2023-08-10 LAB — COMPREHENSIVE METABOLIC PANEL
ALT: 14 [IU]/L (ref 0–44)
AST: 12 [IU]/L (ref 0–40)
Albumin: 4.4 g/dL (ref 4.1–5.1)
Alkaline Phosphatase: 101 [IU]/L (ref 44–121)
BUN/Creatinine Ratio: 15 (ref 9–20)
BUN: 13 mg/dL (ref 6–24)
Bilirubin Total: 0.2 mg/dL (ref 0.0–1.2)
CO2: 19 mmol/L — ABNORMAL LOW (ref 20–29)
Calcium: 9.6 mg/dL (ref 8.7–10.2)
Chloride: 105 mmol/L (ref 96–106)
Creatinine, Ser: 0.87 mg/dL (ref 0.76–1.27)
Globulin, Total: 2.5 g/dL (ref 1.5–4.5)
Glucose: 92 mg/dL (ref 70–99)
Potassium: 4.4 mmol/L (ref 3.5–5.2)
Sodium: 140 mmol/L (ref 134–144)
Total Protein: 6.9 g/dL (ref 6.0–8.5)
eGFR: 105 mL/min/{1.73_m2} (ref >59)

## 2023-08-10 LAB — LIPID PANEL
Chol/HDL Ratio: 3.5 {ratio} (ref 0.0–5.0)
Cholesterol, Total: 148 mg/dL (ref 100–199)
HDL: 42 mg/dL (ref >39)
LDL Chol Calc (NIH): 72 mg/dL (ref 0–99)
Triglycerides: 201 mg/dL — ABNORMAL HIGH (ref 0–149)
VLDL Cholesterol Cal: 34 mg/dL (ref 5–40)

## 2023-08-10 LAB — CBC
Hematocrit: 45.3 % (ref 37.5–51.0)
Hemoglobin: 15 g/dL (ref 13.0–17.7)
MCH: 31.2 pg (ref 26.6–33.0)
MCHC: 33.1 g/dL (ref 31.5–35.7)
MCV: 94 fL (ref 79–97)
Platelets: 489 10*3/uL — ABNORMAL HIGH (ref 150–450)
RBC: 4.81 x10E6/uL (ref 4.14–5.80)
RDW: 11.6 % (ref 11.6–15.4)
WBC: 7.4 10*3/uL (ref 3.4–10.8)

## 2023-08-10 NOTE — Telephone Encounter (Signed)
Patient is agreeable to have referral to Hematology.

## 2023-08-10 NOTE — Addendum Note (Signed)
Addended by: Jonah Blue B on: 08/10/2023 01:37 PM   Modules accepted: Orders

## 2023-09-13 IMAGING — CR DG CHEST 2V
2 series · 2 of 2 positions shown · non-contrast
Comparison: 12/08/2019

CLINICAL DATA: Shortness of breath,

EXAM:
CHEST - 2 VIEW

[w chest pa]
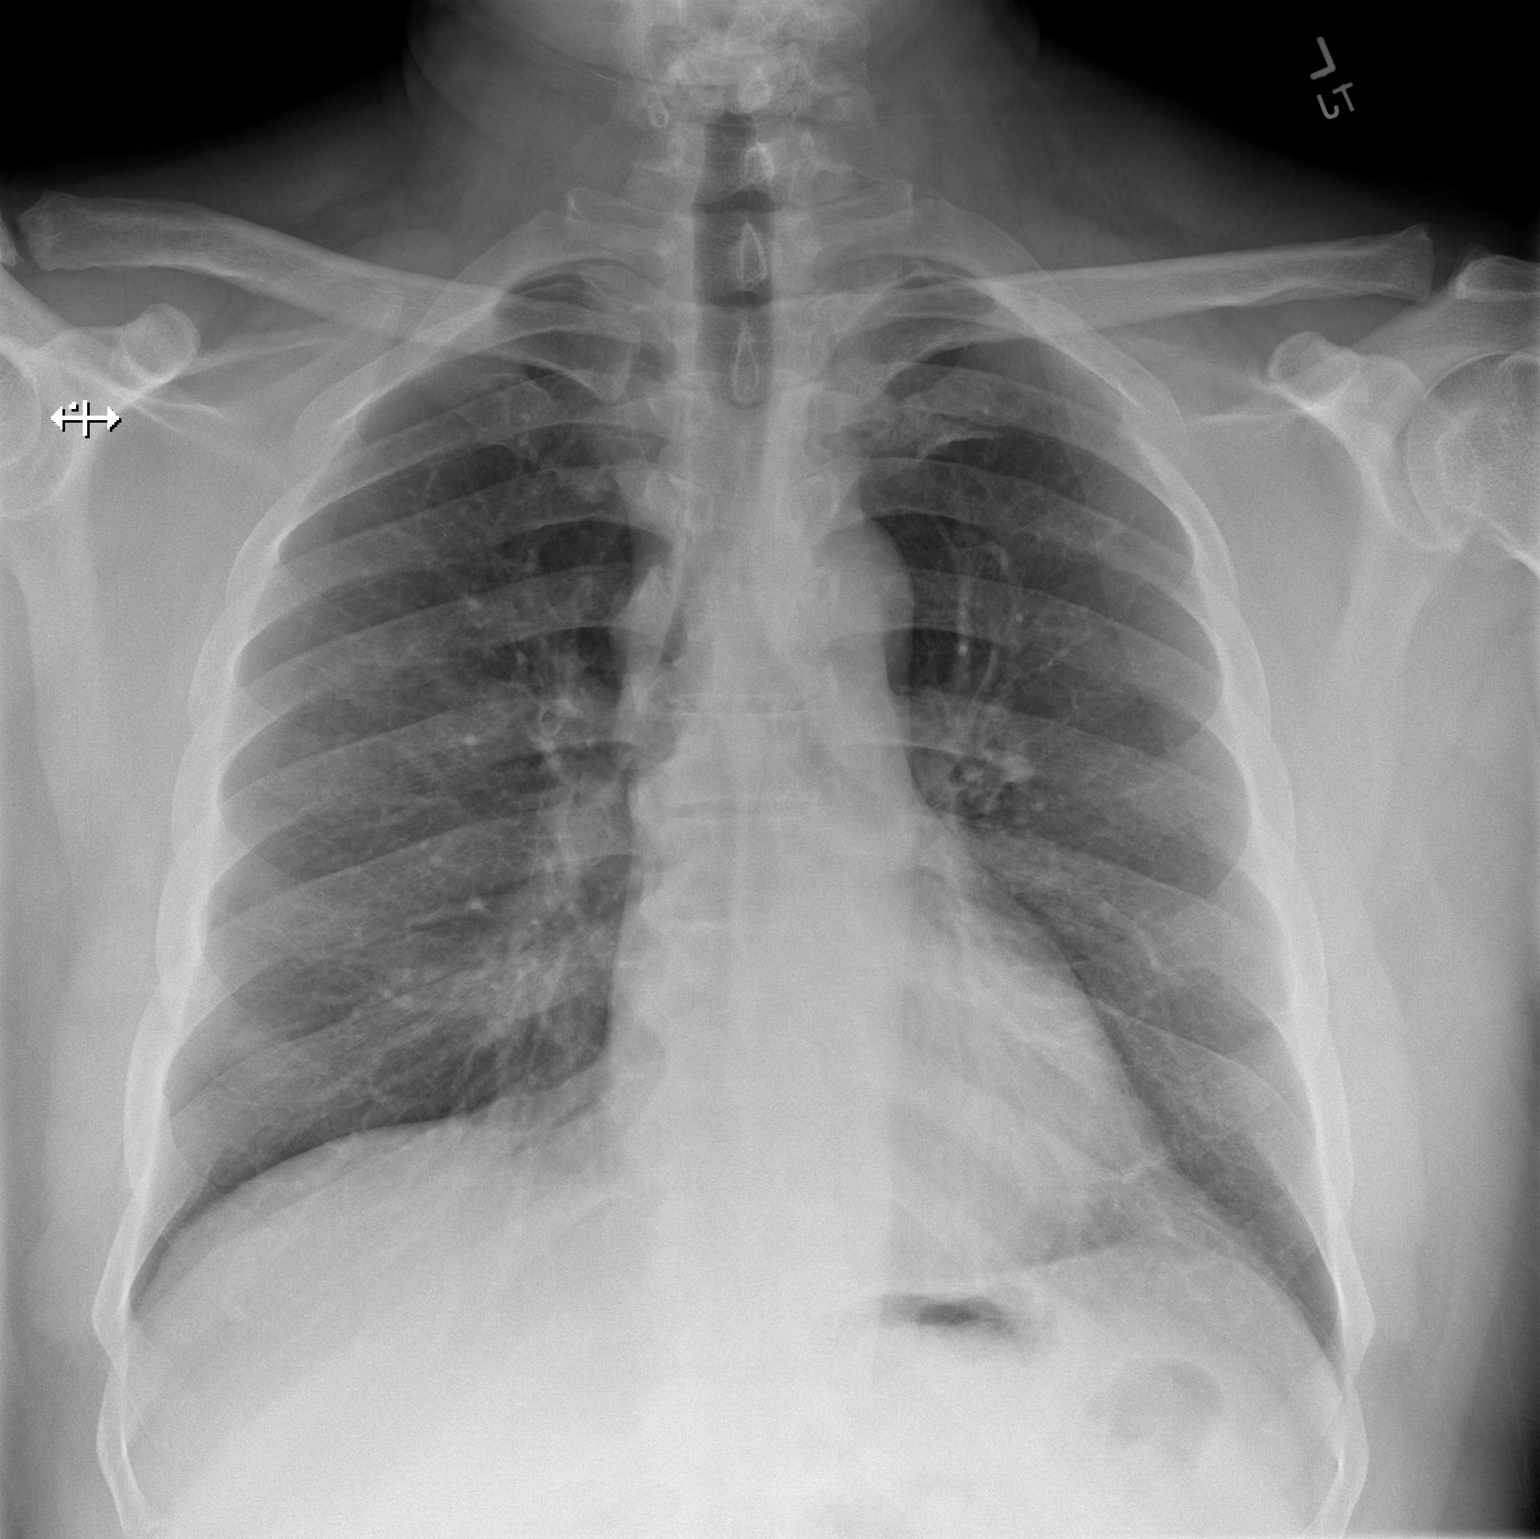

[w chest lat]
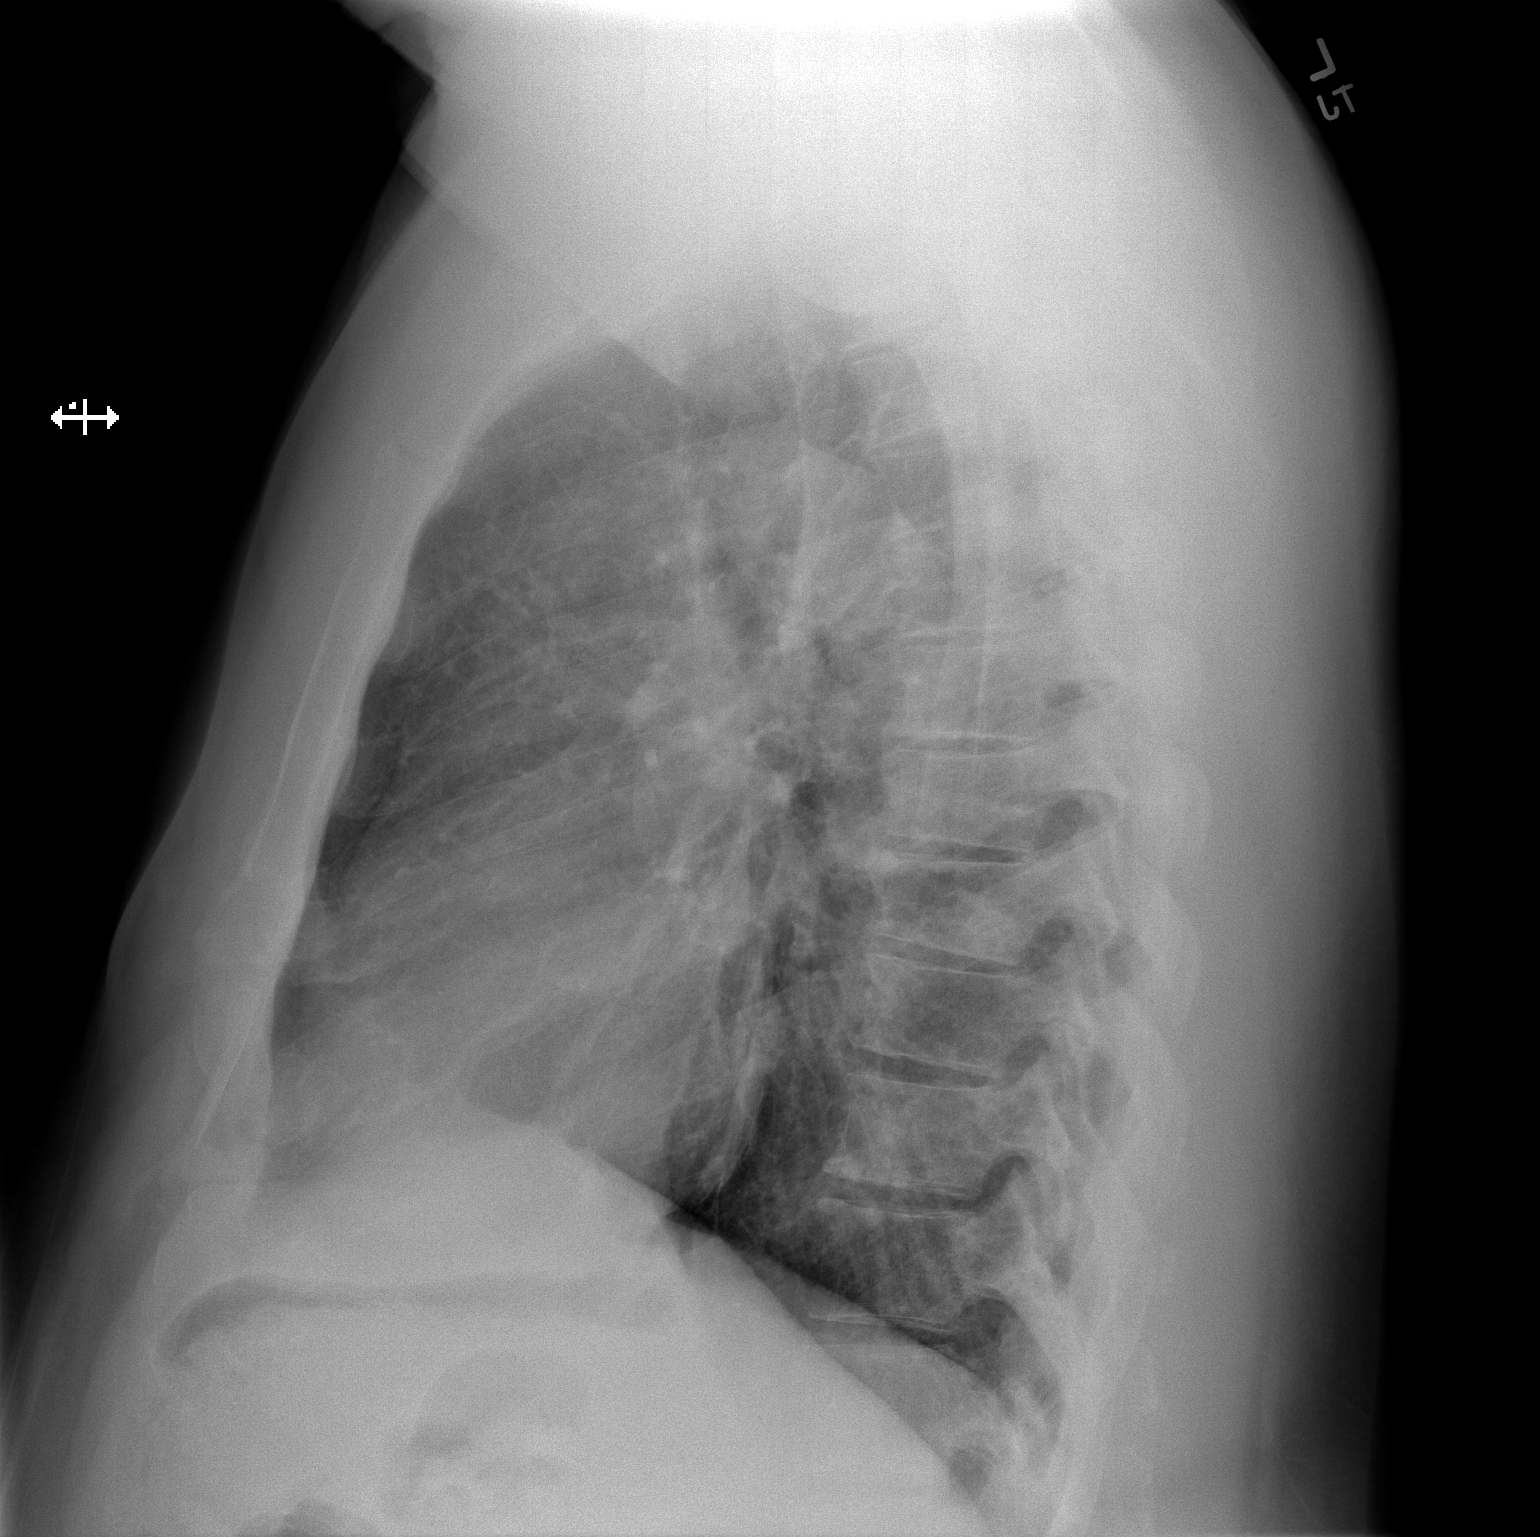

[2 of 2 positions shown; findings below may reference images not displayed]

FINDINGS: The heart size and mediastinal contours are within normal limits. No
focal airspace consolidation, pleural effusion, or pneumothorax. The
visualized skeletal structures are unremarkable.
IMPRESSION: No active cardiopulmonary disease.

## 2023-09-17 ENCOUNTER — Other Ambulatory Visit: Payer: Self-pay

## 2023-09-17 ENCOUNTER — Other Ambulatory Visit (HOSPITAL_COMMUNITY): Payer: Self-pay

## 2023-09-19 ENCOUNTER — Other Ambulatory Visit: Payer: Self-pay

## 2023-10-29 ENCOUNTER — Other Ambulatory Visit (HOSPITAL_COMMUNITY): Payer: Self-pay

## 2023-10-29 ENCOUNTER — Other Ambulatory Visit: Payer: Self-pay

## 2023-10-31 ENCOUNTER — Other Ambulatory Visit: Payer: Self-pay

## 2023-11-28 ENCOUNTER — Other Ambulatory Visit: Payer: Self-pay

## 2023-11-28 ENCOUNTER — Other Ambulatory Visit (HOSPITAL_COMMUNITY): Payer: Self-pay

## 2023-11-29 ENCOUNTER — Other Ambulatory Visit: Payer: Self-pay

## 2023-11-29 ENCOUNTER — Other Ambulatory Visit (HOSPITAL_COMMUNITY): Payer: Self-pay

## 2023-11-30 ENCOUNTER — Other Ambulatory Visit: Payer: Self-pay

## 2023-12-07 ENCOUNTER — Ambulatory Visit: Payer: BLUE CROSS/BLUE SHIELD | Attending: Internal Medicine | Admitting: Internal Medicine

## 2023-12-07 ENCOUNTER — Other Ambulatory Visit: Payer: Self-pay

## 2023-12-07 ENCOUNTER — Encounter: Payer: Self-pay | Admitting: Internal Medicine

## 2023-12-07 VITALS — BP 138/93 | HR 98 | Ht 72.0 in | Wt 283.0 lb

## 2023-12-07 DIAGNOSIS — M170 Bilateral primary osteoarthritis of knee: Secondary | ICD-10-CM | POA: Diagnosis not present

## 2023-12-07 DIAGNOSIS — I429 Cardiomyopathy, unspecified: Secondary | ICD-10-CM | POA: Diagnosis not present

## 2023-12-07 DIAGNOSIS — F17210 Nicotine dependence, cigarettes, uncomplicated: Secondary | ICD-10-CM

## 2023-12-07 DIAGNOSIS — E782 Mixed hyperlipidemia: Secondary | ICD-10-CM

## 2023-12-07 DIAGNOSIS — I10 Essential (primary) hypertension: Secondary | ICD-10-CM | POA: Diagnosis not present

## 2023-12-07 DIAGNOSIS — D75839 Thrombocytosis, unspecified: Secondary | ICD-10-CM

## 2023-12-07 DIAGNOSIS — F17200 Nicotine dependence, unspecified, uncomplicated: Secondary | ICD-10-CM

## 2023-12-07 MED ORDER — ATORVASTATIN CALCIUM 10 MG PO TABS
10.0000 mg | ORAL_TABLET | Freq: Every day | ORAL | 1 refills | Status: DC
  Filled 2023-12-07 – 2024-01-24 (×5): qty 90, 90d supply, fill #0

## 2023-12-07 MED ORDER — CELECOXIB 200 MG PO CAPS
200.0000 mg | ORAL_CAPSULE | Freq: Every day | ORAL | 1 refills | Status: DC | PRN
Start: 2023-12-07 — End: 2024-01-05
  Filled 2023-12-07 – 2023-12-27 (×2): qty 90, 90d supply, fill #0

## 2023-12-07 MED ORDER — VALSARTAN 80 MG PO TABS
80.0000 mg | ORAL_TABLET | Freq: Every day | ORAL | 1 refills | Status: DC
  Filled 2023-12-07 – 2024-01-03 (×3): qty 90, 90d supply, fill #0
  Filled 2024-03-26: qty 90, 90d supply, fill #1

## 2023-12-07 NOTE — Progress Notes (Signed)
 Patient ID: Manuel Welch., male    DOB: 1971/01/31  MRN: [id number]  CC: Hypertension (HTN f/u. Med refills. [place] pain on bilateral knees X8 mo)   Subjective: Manuel Welch is a 53 y.o. male who presents for chronic ds management. His concerns today include:  Patient with history of HTN, obesity,CM with a EF of 30 to 35%; increased to 45-50% on echo 07/2022, tob dep, cocaine use, OA knee   Discussed the use of AI scribe software for clinical note transcription with the patient, who gave verbal consent to proceed.  History of Present Illness Manuel Welch. is a 53 year old male with hypertension and cardiomyopathy who presents for medication management and evaluation of knee pain.  He takes valsartan  40 mg daily for hypertension but lacks a home blood pressure monitor. He limits salt intake and experiences shortness of breath upon waking, without chest pain. No SOB or CP on exertion. Swelling is present in his knees but not in his lower legs.  He has arthritis in both knees with sharp pain and swelling, especially when walking. He takes Celebrex  for pain but is concerned about medication costs due to loss of insurance. He is uninsured as his Blue Cross Blue Shield coverage has lapsed and plans to apply for Medicaid and a [facility] discount to assist with medical expenses.  He smokes one pack of cigarettes every three days and is not ready to quit.    HL: Takes atorvastatin  daily for cholesterol management.   HM: He was previously referred for a colonoscopy but it was sent to [facility].  He prefers to go locally in [place].    He was referred to oncology due to elevated platelet counts but they have not been able to reach him to get him scheduled for an appointment.  Now he is lost insurance and would have to wait until he gets Medicaid or [facility] discount.      Patient Active Problem List   Diagnosis Date Noted   Mixed hyperlipidemia 06/22/2022   Primary osteoarthritis of  right knee 03/20/2022   Encounter to establish care 03/11/2020   Essential hypertension 03/11/2020   Tobacco dependence 03/11/2020   Cardiomyopathy (HCC)    Cocaine use    Obesity, Class III, BMI 40-49.9 (morbid obesity)    Multifocal pneumonia 12/09/2019   Atypical pneumonia 12/08/2019   Chest pain 12/08/2019   Accelerated hypertension    Elevated troponin    Tobacco abuse      Current Outpatient Medications on File Prior to Visit  Medication Sig Dispense Refill   hydrocortisone  (ANUSOL -HC) 2.5 % rectal cream Apply to rectal area daily as needed for rectal pain 30 g 1   polyethylene glycol powder (GLYCOLAX /MIRALAX ) 17 GM/SCOOP powder Mix 1 capful (17 g) in 8 ounces of water/juice by mouth daily as needed. 3350 g 1   atorvastatin  (LIPITOR) 10 MG tablet Take 1 tablet (10 mg total) by mouth daily. 90 tablet 1   celecoxib  (CELEBREX ) 200 MG capsule Take 1 capsule (200 mg total) by mouth daily as needed. 90 capsule 1   valsartan  (DIOVAN ) 40 MG tablet Take 1 tablet (40 mg total) by mouth daily. 90 tablet 1   No current facility-administered medications on file prior to visit.    Allergies  Allergen Reactions   Bee Venom     Social History   Socioeconomic History   Marital status: Single    Spouse name: Not on file   Number of children:  Not on file   Years of education: Not on file   Highest education level: Not on file  Occupational History   Not on file  Tobacco Use   Smoking status: Every Day    Current packs/day: 0.25    Average packs/day: 0.3 packs/day for 29.0 years (7.3 ttl pk-yrs)    Types: Cigarettes   Smokeless tobacco: Never  Vaping Use   Vaping status: Never Used  Substance and Sexual Activity   Alcohol use: Yes   Drug use: No   Sexual activity: Yes  Other Topics Concern   Not on file  Social History Narrative   Lives at home with mother.   Social Drivers of Corporate investment banker Strain: Not on file  Food Insecurity: No Food Insecurity  (05/05/2021)   Hunger Vital Sign    Worried About Running Out of Food in the Last Year: Never true    Ran Out of Food in the Last Year: Never true  Transportation Needs: No Transportation Needs (05/05/2021)   PRAPARE - Administrator, Civil Service (Medical): No    Lack of Transportation (Non-Medical): No  Physical Activity: Not on file  Stress: Not on file  Social Connections: Not on file  Intimate Partner Violence: Not on file    Family History  Problem Relation Age of Onset   Hypertension Mother    Hypertension Father    Hypertension Brother     Past Surgical History:  Procedure Laterality Date   NO PAST SURGERIES      ROS: Review of Systems Negative except as stated above  PHYSICAL EXAM: BP (!) 138/93 (BP Location: Left Arm, Patient Position: Sitting, Cuff Size: Large)   Pulse 98   Ht 6' (1.829 m)   Wt 283 lb (128.4 kg)   SpO2 97%   BMI 38.38 kg/m   Physical Exam   General appearance - alert, well appearing, and in no distress Mental status - normal mood, behavior, speech, dress, motor activity, and thought processes Chest - clear to auscultation, no wheezes, rales or rhonchi, symmetric air entry Heart - normal rate, regular rhythm, normal S1, S2, no murmurs, rubs, clicks or gallops Musculoskeletal -knees: He has enlargement of both joints.  He has mild to moderate crepitus on passive range of motion of the right knee and mild crepitus on passive range of motion of the left knee. Extremities - peripheral pulses normal, no pedal edema, no clubbing or cyanosis     Latest Ref Rng & Units 08/09/2023    3:54 PM 06/22/2022    9:39 AM 05/30/2022    9:15 AM  CMP  Glucose 70 - 99 mg/dL 92   97   BUN 6 - 24 mg/dL 13   12   Creatinine 8.46 - 1.27 mg/dL 9.62   9.52   Sodium 841 - 144 mmol/L 140   142   Potassium 3.5 - 5.2 mmol/L 4.4   4.5   Chloride 96 - 106 mmol/L 105   106   CO2 20 - 29 mmol/L 19   20   Calcium  8.7 - 10.2 mg/dL 9.6   32.4   Total  Protein 6.0 - 8.5 g/dL 6.9  7.1    Total Bilirubin 0.0 - 1.2 mg/dL 0.2  0.5    Alkaline Phos 44 - 121 IU/L 101  110    AST 0 - 40 IU/L 12  13    ALT 0 - 44 IU/L 14  12  Lipid Panel     Component Value Date/Time   CHOL 148 08/09/2023 1554   TRIG 201 (H) 08/09/2023 1554   HDL 42 08/09/2023 1554   CHOLHDL 3.5 08/09/2023 1554   CHOLHDL 5.2 12/09/2019 0527   VLDL 18 12/09/2019 0527   LDLCALC 72 08/09/2023 1554    CBC    Component Value Date/Time   WBC 7.4 08/09/2023 1554   WBC 9.4 03/31/2022 2209   RBC 4.81 08/09/2023 1554   RBC 4.71 03/31/2022 2209   HGB 15.0 08/09/2023 1554   HCT 45.3 08/09/2023 1554   PLT 489 (H) 08/09/2023 1554   MCV 94 08/09/2023 1554   MCH 31.2 08/09/2023 1554   MCH 31.2 03/31/2022 2209   MCHC 33.1 08/09/2023 1554   MCHC 32.4 03/31/2022 2209   RDW 11.6 08/09/2023 1554   LYMPHSABS 2.3 03/31/2022 2209   MONOABS 0.8 03/31/2022 2209   EOSABS 0.4 03/31/2022 2209   BASOSABS 0.1 03/31/2022 2209    ASSESSMENT AND PLAN: 1. Essential hypertension (Primary) 2. Cardiomyopathy, unspecified type (HCC) Given his cardiomyopathy and uncontrolled blood pressure, I wanted to add carvedilol but patient is having issues with affording his medications and does not wish to add another medicine at this time.  Therefore we agreed to increase the valsartan  to 80 mg instead.  Once he has insurance, we can revisit adding carvedilol or changing to Entresto and getting a repeat echocardiogram. - atorvastatin  (LIPITOR) 10 MG tablet; Take 1 tablet (10 mg total) by mouth daily.  Dispense: 90 tablet; Refill: 1 - valsartan  (DIOVAN ) 80 MG tablet; Take 1 tablet (80 mg total) by mouth daily.  Dispense: 90 tablet; Refill: 1  3. Primary osteoarthritis of both knees Refill sent on Celebrex .  Will refer to Ortho. - celecoxib  (CELEBREX ) 200 MG capsule; Take 1 capsule (200 mg total) by mouth daily as needed.  Dispense: 90 capsule; Refill: 1 - AMB referral to orthopedics  4. Tobacco  dependence Advised to quit.  Patient not ready to give a trial of quitting.  5. Mixed hyperlipidemia Continue atorvastatin   6. Thrombocytosis Will recheck CBC on subsequent visit.  Patient advised to apply for [facility] discount.  Also advised to go to the [facility] office on the fourth floor to apply for Medicaid.   Patient was given the opportunity to ask questions.  Patient verbalized understanding of the plan and was able to repeat key elements of the plan.   This documentation was completed using Paediatric nurse.  Any transcriptional errors are unintentional.  No orders of the defined types were placed in this encounter.    Requested Prescriptions   Pending Prescriptions Disp Refills   atorvastatin  (LIPITOR) 10 MG tablet 90 tablet 1    Sig: Take 1 tablet (10 mg total) by mouth daily.   celecoxib  (CELEBREX ) 200 MG capsule 90 capsule 1    Sig: Take 1 capsule (200 mg total) by mouth daily as needed.   valsartan  (DIOVAN ) 40 MG tablet 90 tablet 1    Sig: Take 1 tablet (40 mg total) by mouth daily.    No follow-ups on file.  Concetta Dee, MD, FACP

## 2023-12-19 ENCOUNTER — Other Ambulatory Visit: Payer: Self-pay

## 2023-12-26 ENCOUNTER — Ambulatory Visit: Admitting: Orthopaedic Surgery

## 2023-12-27 ENCOUNTER — Other Ambulatory Visit (HOSPITAL_COMMUNITY): Payer: Self-pay

## 2024-01-02 ENCOUNTER — Other Ambulatory Visit (HOSPITAL_COMMUNITY): Payer: Self-pay

## 2024-01-03 ENCOUNTER — Other Ambulatory Visit (HOSPITAL_COMMUNITY): Payer: Self-pay

## 2024-01-03 ENCOUNTER — Other Ambulatory Visit: Payer: Self-pay

## 2024-01-04 ENCOUNTER — Emergency Department (HOSPITAL_COMMUNITY): Payer: Self-pay

## 2024-01-04 ENCOUNTER — Other Ambulatory Visit: Payer: Self-pay

## 2024-01-04 ENCOUNTER — Encounter (HOSPITAL_COMMUNITY): Payer: Self-pay | Admitting: *Deleted

## 2024-01-04 ENCOUNTER — Observation Stay (HOSPITAL_COMMUNITY)
Admission: EM | Admit: 2024-01-04 | Discharge: 2024-01-05 | Disposition: A | Discharge status: Home / Self Care | Payer: Self-pay

## 2024-01-04 DIAGNOSIS — I10 Essential (primary) hypertension: Secondary | ICD-10-CM | POA: Diagnosis present

## 2024-01-04 DIAGNOSIS — I110 Hypertensive heart disease with heart failure: Secondary | ICD-10-CM | POA: Insufficient documentation

## 2024-01-04 DIAGNOSIS — D72829 Elevated white blood cell count, unspecified: Secondary | ICD-10-CM | POA: Insufficient documentation

## 2024-01-04 DIAGNOSIS — R0609 Other forms of dyspnea: Secondary | ICD-10-CM

## 2024-01-04 DIAGNOSIS — R7989 Other specified abnormal findings of blood chemistry: Secondary | ICD-10-CM

## 2024-01-04 DIAGNOSIS — I5032 Chronic diastolic (congestive) heart failure: Principal | ICD-10-CM | POA: Insufficient documentation

## 2024-01-04 DIAGNOSIS — Z7982 Long term (current) use of aspirin: Secondary | ICD-10-CM | POA: Insufficient documentation

## 2024-01-04 DIAGNOSIS — E785 Hyperlipidemia, unspecified: Secondary | ICD-10-CM | POA: Insufficient documentation

## 2024-01-04 DIAGNOSIS — F17220 Nicotine dependence, chewing tobacco, uncomplicated: Secondary | ICD-10-CM | POA: Insufficient documentation

## 2024-01-04 DIAGNOSIS — I160 Hypertensive urgency: Principal | ICD-10-CM

## 2024-01-04 DIAGNOSIS — M170 Bilateral primary osteoarthritis of knee: Secondary | ICD-10-CM | POA: Insufficient documentation

## 2024-01-04 DIAGNOSIS — F17210 Nicotine dependence, cigarettes, uncomplicated: Secondary | ICD-10-CM | POA: Insufficient documentation

## 2024-01-04 DIAGNOSIS — Z720 Tobacco use: Secondary | ICD-10-CM | POA: Diagnosis present

## 2024-01-04 LAB — CBC WITH DIFFERENTIAL/PLATELET
Abs Immature Granulocytes: 0.06 K/uL (ref 0.00–0.07)
Basophils Absolute: 0.1 K/uL (ref 0.0–0.1)
Basophils Relative: 1 %
Eosinophils Absolute: 0.2 K/uL (ref 0.0–0.5)
Eosinophils Relative: 2 %
HCT: 43.3 % (ref 39.0–52.0)
Hemoglobin: 13.8 g/dL (ref 13.0–17.0)
Immature Granulocytes: 1 %
Lymphocytes Relative: 19 %
Lymphs Abs: 2.2 K/uL (ref 0.7–4.0)
MCH: 31.4 pg (ref 26.0–34.0)
MCHC: 31.9 g/dL (ref 30.0–36.0)
MCV: 98.4 fL (ref 80.0–100.0)
Monocytes Absolute: 1 K/uL (ref 0.1–1.0)
Monocytes Relative: 9 %
Neutro Abs: 8.2 K/uL — ABNORMAL HIGH (ref 1.7–7.7)
Neutrophils Relative %: 68 %
Platelets: 356 K/uL (ref 150–400)
RBC: 4.4 MIL/uL (ref 4.22–5.81)
RDW: 12.2 % (ref 11.5–15.5)
WBC: 11.7 K/uL — ABNORMAL HIGH (ref 4.0–10.5)
nRBC: 0 % (ref 0.0–0.2)

## 2024-01-04 LAB — BASIC METABOLIC PANEL WITH GFR
Anion gap: 13 (ref 5–15)
BUN: 12 mg/dL (ref 6–20)
CO2: 20 mmol/L — ABNORMAL LOW (ref 22–32)
Calcium: 9.4 mg/dL (ref 8.9–10.3)
Chloride: 106 mmol/L (ref 98–111)
Creatinine, Ser: 1.02 mg/dL (ref 0.61–1.24)
GFR, Estimated: >60 mL/min (ref >60)
Glucose, Bld: 78 mg/dL (ref 70–99)
Potassium: 4 mmol/L (ref 3.5–5.1)
Sodium: 139 mmol/L (ref 135–145)

## 2024-01-04 LAB — HIV ANTIBODY (ROUTINE TESTING W REFLEX): HIV Screen 4th Generation wRfx: NONREACTIVE

## 2024-01-04 LAB — TROPONIN I (HIGH SENSITIVITY)
Troponin I (High Sensitivity): 27 ng/L — ABNORMAL HIGH (ref <18)
Troponin I (High Sensitivity): 26 ng/L — ABNORMAL HIGH (ref <18)

## 2024-01-04 LAB — BRAIN NATRIURETIC PEPTIDE: B Natriuretic Peptide: 120.9 pg/mL — ABNORMAL HIGH (ref 0.0–100.0)

## 2024-01-04 MED ORDER — FUROSEMIDE 10 MG/ML IJ SOLN
40.0000 mg | Freq: Once | INTRAMUSCULAR | Status: AC
  Administered 2024-01-04: 40 mg via INTRAVENOUS
  Filled 2024-01-04: qty 4

## 2024-01-04 MED ORDER — LABETALOL HCL 5 MG/ML IV SOLN
20.0000 mg | Freq: Once | INTRAVENOUS | Status: DC
  Filled 2024-01-04: qty 4

## 2024-01-04 MED ORDER — ATORVASTATIN CALCIUM 10 MG PO TABS
10.0000 mg | ORAL_TABLET | Freq: Every day | ORAL | Status: DC
Start: 2024-01-05 — End: 2024-01-05
  Administered 2024-01-05: 10 mg via ORAL
  Filled 2024-01-04: qty 1

## 2024-01-04 MED ORDER — IRBESARTAN 75 MG PO TABS
75.0000 mg | ORAL_TABLET | Freq: Every day | ORAL | Status: DC
  Administered 2024-01-05: 75 mg via ORAL
  Filled 2024-01-04: qty 1

## 2024-01-04 MED ORDER — ACETAMINOPHEN 325 MG PO TABS: 650.0000 mg | ORAL_TABLET | Freq: Four times a day (QID) | ORAL | Status: DC | PRN

## 2024-01-04 MED ORDER — ACETAMINOPHEN 650 MG RE SUPP: 650.0000 mg | Freq: Four times a day (QID) | RECTAL | Status: DC | PRN

## 2024-01-04 MED ORDER — NICOTINE 14 MG/24HR TD PT24
14.0000 mg | MEDICATED_PATCH | Freq: Every day | TRANSDERMAL | Status: DC
  Administered 2024-01-04 – 2024-01-05 (×3): 14 mg via TRANSDERMAL
  Filled 2024-01-04 (×2): qty 1

## 2024-01-04 MED ORDER — RIVAROXABAN 10 MG PO TABS
10.0000 mg | ORAL_TABLET | Freq: Every day | ORAL | Status: DC
  Administered 2024-01-04 – 2024-01-05 (×2): 10 mg via ORAL
  Filled 2024-01-04 (×2): qty 1

## 2024-01-04 MED ORDER — NICOTINE POLACRILEX 2 MG MT GUM: 2.0000 mg | CHEWING_GUM | OROMUCOSAL | Status: DC | PRN

## 2024-01-04 MED ORDER — ASPIRIN 81 MG PO CHEW
324.0000 mg | CHEWABLE_TABLET | Freq: Once | ORAL | Status: AC
  Administered 2024-01-04: 324 mg via ORAL
  Filled 2024-01-04: qty 4

## 2024-01-04 MED ORDER — DICLOFENAC SODIUM 1 % EX GEL
2.0000 g | Freq: Four times a day (QID) | CUTANEOUS | Status: DC
  Administered 2024-01-04 – 2024-01-05 (×2): 2 g via TOPICAL
  Filled 2024-01-04: qty 100

## 2024-01-04 MED ORDER — NITROGLYCERIN 2 % TD OINT
0.5000 [in_us] | TOPICAL_OINTMENT | Freq: Once | TRANSDERMAL | Status: AC
  Administered 2024-01-04: 0.5 [in_us] via TOPICAL
  Filled 2024-01-04: qty 1

## 2024-01-04 MED ORDER — SENNOSIDES-DOCUSATE SODIUM 8.6-50 MG PO TABS: 1.0000 | ORAL_TABLET | Freq: Every evening | ORAL | Status: DC | PRN

## 2024-01-04 NOTE — ED Triage Notes (Signed)
 C/o sob for several days c/o his blood pressure is high today states he is taking his blood pressure medication

## 2024-01-04 NOTE — Hospital Course (Addendum)
#  Shortness of breath #HFrEF (45-50% on 07/26/22) #Severe hypertension #Leukocytosis Presented with shortness of breath for three days that is worse when laying down or with exertion. Also had pleuritic chest pain. He ran out of his medications about a week prior to admission. His home regimen is Valsartan  80mg  and Amlodipine  10mg  per his report, but his fill history only shows Valsartan  80mg . He had crackles at his lung bases, but no JVD or lower extremity edema. His chest x-ray showed increased interstitial markings. BNP elevated at 120.9. He had had a non-productive cough and been afebrile. His troponins were flat, making ACS highly unlikely. Symptoms thought to be mild HFrEF exacerbation in the setting of missing afterload reducing medications. His BP on the morning of discharge was 132/91. Received one dose IV lasix  with improvement of his symptoms without any crackles on lung exam the day of discharge. Discharged with Valsartan  and new medication of Coreg  6.25 BID for GDMT as this will be covered on the free medications list at the patients pharmacy. His leukocytosis also improved without antibiotics. Updated echocardiogram after diuresis obtained but official reading not returned before discharge.    #Headache  Has had daily headaches in the morning when he wakes up, right before taking medications. Most likely in the setting of uncontrolled hypertension. Headaches improved with BP control.    Stable chronic problems   #HLD Continue home Atorvastatin  10mg    #Bilateral knee osteoarthritis Celebrex  held in the setting of diuresis, will discontinue in the setting of heart failure. Prescribed Diclofenac  gel on discharge   #Current smoker Uninterested in quitting at this time. Smokes 1/3 of a pack a day -Nicotine  patch and gum while admitted.

## 2024-01-04 NOTE — ED Provider Triage Note (Signed)
 Emergency Medicine Provider Triage Evaluation Note  Manuel Welch. , a 53 y.o. male  was evaluated in triage.  Pt complains of shortness of breath.  States it happens when he gets up and walks around times issues.  States is about for 3 days.  States he noticed his blood pressure was high today.  States he has been compliant with his blood pressure medicines  Review of Systems  Positive: Shortness of breath Negative: Chest pain, leg swelling  Physical Exam  BP (!) 177/113   Pulse (!) 104   Temp 98 F (36.7 C)   Resp 17   Ht 6' 1 (1.854 m)   Wt (!) 138.3 kg   SpO2 100%   BMI 40.24 kg/m  Gen:   Awake, no distress   Resp:  Normal effort  MSK:   Moves extremities without difficulty    Medical Decision Making  Medically screening exam initiated at 10:41 AM.  Appropriate orders placed.  Manuel Welch. was informed that the remainder of the evaluation will be completed by another provider, this initial triage assessment does not replace that evaluation, and the importance of remaining in the ED until their evaluation is complete.  Manuel Fusi, DO    Manuel Amara L, DO 01/04/24 [phone]

## 2024-01-04 NOTE — ED Notes (Signed)
 Pt ambulatory to the bathroom without any SHOB.

## 2024-01-04 NOTE — ED Provider Notes (Signed)
 [place] EMERGENCY DEPARTMENT AT [facility] Provider Note   CSN: [id number] Arrival date & time: 01/04/24  1002     Patient presents with: Shortness of Breath   Manuel Welch. is a 53 y.o. male.   53 year old male presents for evaluation of shortness of breath.  States has been getting worse the last couple days.  It started out to his shortness of breath with walking but now it is when he bends over to tie his shoe.  He states he noticed his blood pressure is high today as well.  Has had some off-and-on chest pain as well.  States has been compliant with his blood pressure meds.  Denies any other symptoms or concerns at this time.   Shortness of Breath Associated symptoms: chest pain   Associated symptoms: no abdominal pain, no cough, no ear pain, no fever, no rash, no sore throat and no vomiting        Prior to Admission medications   Medication Sig Start Date End Date Taking? Authorizing Provider  atorvastatin  (LIPITOR) 10 MG tablet Take 1 tablet (10 mg total) by mouth daily. 12/07/23  Yes Vicci Barnie NOVAK, MD  celecoxib  (CELEBREX ) 200 MG capsule Take 1 capsule (200 mg total) by mouth daily as needed. Patient taking differently: Take 200 mg by mouth daily. 12/07/23  Yes Vicci Barnie NOVAK, MD  valsartan  (DIOVAN ) 80 MG tablet Take 1 tablet (80 mg total) by mouth daily. 12/07/23  Yes Vicci Barnie NOVAK, MD    Allergies: Bee venom    Review of Systems  Constitutional:  Negative for chills and fever.  HENT:  Negative for ear pain and sore throat.   Eyes:  Negative for pain and visual disturbance.  Respiratory:  Positive for shortness of breath. Negative for cough.   Cardiovascular:  Positive for chest pain. Negative for palpitations.  Gastrointestinal:  Negative for abdominal pain and vomiting.  Genitourinary:  Negative for dysuria and hematuria.  Musculoskeletal:  Negative for arthralgias and back pain.  Skin:  Negative for color change and rash.  Neurological:   Negative for seizures and syncope.  All other systems reviewed and are negative.   Updated Vital Signs BP (!) 151/105   Pulse 94   Temp 98.1 F (36.7 C) (Oral)   Resp 18   Ht 6' 1 (1.854 m)   Wt (!) 138.3 kg   SpO2 100%   BMI 40.24 kg/m   Physical Exam Vitals and nursing note reviewed.  Constitutional:      General: He is not in acute distress.    Appearance: He is well-developed.  HENT:     Head: Normocephalic and atraumatic.  Eyes:     Conjunctiva/sclera: Conjunctivae normal.  Cardiovascular:     Rate and Rhythm: Normal rate and regular rhythm.     Heart sounds: No murmur heard. Pulmonary:     Effort: Pulmonary effort is normal. No respiratory distress.     Breath sounds: Normal breath sounds. No decreased breath sounds.  Abdominal:     Palpations: Abdomen is soft.     Tenderness: There is no abdominal tenderness.  Musculoskeletal:        General: No swelling.     Cervical back: Neck supple.  Skin:    General: Skin is warm and dry.     Capillary Refill: Capillary refill takes less than 2 seconds.  Neurological:     Mental Status: He is alert.  Psychiatric:        Mood  and Affect: Mood normal.     (all labs ordered are listed, but only abnormal results are displayed) Labs Reviewed  BASIC METABOLIC PANEL WITH GFR - Abnormal; Notable for the following components:      Result Value   CO2 20 (*)    All other components within normal limits  BRAIN NATRIURETIC PEPTIDE - Abnormal; Notable for the following components:   B Natriuretic Peptide 120.9 (*)    All other components within normal limits  CBC WITH DIFFERENTIAL/PLATELET - Abnormal; Notable for the following components:   WBC 11.7 (*)    Neutro Abs 8.2 (*)    All other components within normal limits  TROPONIN I (HIGH SENSITIVITY) - Abnormal; Notable for the following components:   Troponin I (High Sensitivity) 27 (*)    All other components within normal limits  TROPONIN I (HIGH SENSITIVITY)     EKG: EKG Interpretation Date/Time:  Friday January 04 2024 10:10:49 EDT Ventricular Rate:  98 PR Interval:  156 QRS Duration:  88 QT Interval:  382 QTC Calculation: 487 R Axis:   51  Text Interpretation: Normal sinus rhythm Minimal voltage criteria for LVH, may be normal variant ( Sokolow-Lyon ) No STEMI No significant change when compared with previous EKG from 03/31/2022 Confirmed by Gennaro Bouchard ([id number]) on 01/04/2024 10:36:01 AM  Radiology: [facility] Chest 1 View Result Date: 01/04/2024 CLINICAL DATA:  Shortness of breath. EXAM: CHEST  1 VIEW COMPARISON:  04/10/2022. FINDINGS: Note is made of elevated right hemidiaphragm. Bilateral lung fields are clear. Bilateral costophrenic angles are clear. Normal cardio-mediastinal silhouette. No acute osseous abnormalities. The soft tissues are within normal limits. IMPRESSION: No active disease. Electronically Signed   By: Ree Molt M.D.   On: 01/04/2024 11:10     Procedures   Medications Ordered in the ED  labetalol (NORMODYNE) injection 20 mg (20 mg Intravenous Not Given 01/04/24 1454)  aspirin  chewable tablet 324 mg (324 mg Oral Given 01/04/24 1454)  nitroGLYCERIN  (NITROGLYN) 2 % ointment 0.5 inch (0.5 inches Topical Given 01/04/24 1454)                                    Medical Decision Making Medical Decision Making Nursing notes are reviewed. Differential diagnosis for this patient would include but not limited to: CHF, ACS, NSTEMI, hypertensive urgency, other  Cardiac monitor interpretation: sinus rhythm, no ectopy   Emergency Department Course:  Vital signs and pulse oximetry are reviewed, evaluated by myself and found to be within normal limits prior to final disposition. Findings of laboratory testing and medical imaging are discussed with patient and family that is available. Patient agrees with the medical care plan as follows:  Patient lab workup reviewed by me and BNP is slightly elevated troponin is elevated.  On  review of his previous labs he does not usually do troponin leak.  Is given nitro and aspirin  as he is still somewhat hypertensive.  Labetalol was ordered as well.  Discussed patient's case with hospitalist and patient will be admitted for further workup and management.  Patient is agreeable with the plan.  Problems Addressed: Dyspnea on exertion: undiagnosed new problem with uncertain prognosis Elevated troponin: undiagnosed new problem with uncertain prognosis Hypertensive urgency: acute illness or injury that poses a threat to life or bodily functions  Amount and/or Complexity of Data Reviewed External Data Reviewed: notes.    Details: Prior outpatient records reviewed and patient does  follow-up outpatient for hypertension with his primary care doctor, last visit was last month Labs: ordered. Decision-making details documented in ED Course.    Details: Ordered and reviewed by me.  Troponin is elevated patient does not have a baseline troponin leak.  BNP is elevated as well Radiology: ordered and independent interpretation performed. Decision-making details documented in ED Course.    Details: Chest x-ray ordered and interpreted independently radiology Chest x-ray shows no acute abnormality in the chest and no vascular congestion ECG/medicine tests: ordered and independent interpretation performed. Decision-making details documented in ED Course.    Details: Ordered and interpreted by me independently of cardiology and shows sinus rhythm no STEMI no acute change when compared to prior Discussion of management or test interpretation with external provider(s): Discussed with resident for internal medicine hospitalist service and patient will be admitted for further workup and management  Risk OTC drugs. Prescription drug management. Drug therapy requiring intensive monitoring for toxicity. Decision regarding hospitalization.     Final diagnoses:  Dyspnea on exertion  Elevated troponin   Hypertensive urgency    ED Discharge Orders     None          Gennaro Duwaine CROME, DO 01/04/24 1523

## 2024-01-04 NOTE — ED Notes (Signed)
 Pt given a sandwich and something to eat per provider ok.

## 2024-01-04 NOTE — ED Notes (Signed)
 Warm blanket & sandwich bag provided

## 2024-01-04 NOTE — H&P (Signed)
 Date: 01/04/2024               Patient Name:  Manuel Welch. MRN: [id number]  DOB: [birth year] Age / Sex: 53 y.o., male   PCP: Vicci Barnie NOVAK, MD         Medical Service: Internal Medicine Teaching Service         Attending Physician: Dr. Mliss Foot      First Contact: Melvenia Morrison, MD    Second Contact: Dr. Ozell Nearing, DO          Pager Information: First Contact Pager: [phone]   Second Contact Pager: [phone]   SUBJECTIVE   Chief Complaint: Shortness of breath, headache  History of Present Illness: Manuel Welch. is a 53 y.o. male with PMH of HTN, HFrEF (45-50% in February of 2024), HLD, and Osteoarthritis who presents with a chief concern for shortness of breath and lightheadedness.  Presents with dyspnea on exertion for past few 3 days. He had been out of his anti-hypertensive meds for about a week and only had his Valsartan  refilled yesterday. Reports associated dizziness when bending forward and tying shoe laces. Denies LOC. Endorses orthopnea and dry cough. Endorses chest pain that is sharp that worsen with cough and deep inhalation. This morning, he states that his blood pressure was 230 or something. Unable to walk to store due to SOB. Has had headache relieved with tylenol . Denies vision changes, n/v, abdominal pain. No home oxygen or CPAP. No fever, chills or sick contacts.   Endorses chronic bilateral knee pain with hx of OA.   In the ED, he presented with a BP of 177/113 and was tachycardic to 104. He received Labetalol 20mg , Aspirin  and a Nitroglycerin   ED Course: Labs significant for mild troponin elevation to 27 then 26. BNP of 120.9. Mild acidosis and mild leukocytosis Imaging EKG with NSR. Chest x-ray showed no active disease per the radiology read but with bilateral haziness compared with prior x-ray.   Meds:  Patient reported:  -Valsartan  80 mg daily -atorvastatin  10 mg daily (ran out) -celebrex  200 mg daily for knee OA (ran out) -amlodipine   10mg  (ran out) No outpatient medications have been marked as taking for the 01/04/24 encounter [facility] Encounter).    Past Medical History Hypertension, Hyperlipidemia, HFrEF (45-50% on 07/26/22), osteoarthritis Past Medical History:  Diagnosis Date   Hypertension     Past Surgical History Past Surgical History:  Procedure Laterality Date   NO PAST SURGERIES     Social:  Lives With: mother  Occupation: [employer] [place]  Support: family  Level of Function: independent with ADLs and IADLs PCP:  Vicci Barnie NOVAK, MD ([facility]) Substances: --Tobacco: 1/3 ppd for 33 years --EtOH: beer (6 x 40oz beer on weekends)  --Recreational Drug: denies   Family History:  Family History  Problem Relation Age of Onset   Hypertension Mother    Hypertension Father    Hypertension Brother      Allergies: Allergies as of 01/04/2024 - Review Complete 01/04/2024  Allergen Reaction Noted   Bee venom  11/19/2015    Review of Systems: A complete ROS was negative except as per HPI.   OBJECTIVE:   Physical Exam: Blood pressure (!) 151/105, pulse 94, temperature 98.1 F (36.7 C), temperature source Oral, resp. rate 18, height 6' 1 (1.854 m), weight (!) 138.3 kg, SpO2 100%.  Constitutional: well-appearing, lying in bed, in no acute distress HENT: normocephalic atraumatic, mucous membranes moist Eyes: conjunctiva non-erythematous Neck: supple, no JVD  Cardiovascular: regular rate and rhythm, no m/r/g. DP pulses 2+.  Pulmonary/Chest: Mildly short of breath when speaking but able to complete full sentences. Crackles at the bases bilaterally. Abdominal: soft, non-tender, non-distended MSK: normal bulk and tone. No peripheral edema Neurological: alert & oriented x 3, 5/5 strength in bilateral upper and lower extremities, normal gait Skin: warm and dry  POCUS: Mildly decreased ejection fraction. No right heart dilation.  Labs: CBC    Component Value Date/Time   WBC 11.7 (H) 01/04/2024 1051   RBC  4.40 01/04/2024 1051   HGB 13.8 01/04/2024 1051   HGB 15.0 08/09/2023 1554   HCT 43.3 01/04/2024 1051   HCT 45.3 08/09/2023 1554   PLT 356 01/04/2024 1051   PLT 489 (H) 08/09/2023 1554   MCV 98.4 01/04/2024 1051   MCV 94 08/09/2023 1554   MCH 31.4 01/04/2024 1051   MCHC 31.9 01/04/2024 1051   RDW 12.2 01/04/2024 1051   RDW 11.6 08/09/2023 1554   LYMPHSABS 2.2 01/04/2024 1051   MONOABS 1.0 01/04/2024 1051   EOSABS 0.2 01/04/2024 1051   BASOSABS 0.1 01/04/2024 1051     CMP     Component Value Date/Time   NA 139 01/04/2024 1051   NA 140 08/09/2023 1554   K 4.0 01/04/2024 1051   CL 106 01/04/2024 1051   CO2 20 (L) 01/04/2024 1051   GLUCOSE 78 01/04/2024 1051   BUN 12 01/04/2024 1051   BUN 13 08/09/2023 1554   CREATININE 1.02 01/04/2024 1051   CALCIUM  9.4 01/04/2024 1051   PROT 6.9 08/09/2023 1554   ALBUMIN 4.4 08/09/2023 1554   AST 12 08/09/2023 1554   ALT 14 08/09/2023 1554   ALKPHOS 101 08/09/2023 1554   BILITOT 0.2 08/09/2023 1554   GFRNONAA >60 01/04/2024 1051   GFRAA >60 12/10/2019 0501    Imaging:  DG Chest 1 View Result Date: 01/04/2024 CLINICAL DATA:  Shortness of breath. EXAM: CHEST  1 VIEW COMPARISON:  04/10/2022. FINDINGS: Note is made of elevated right hemidiaphragm. Bilateral lung fields are clear. Bilateral costophrenic angles are clear. Normal cardio-mediastinal silhouette. No acute osseous abnormalities. The soft tissues are within normal limits. IMPRESSION: No active disease. Electronically Signed   By: Ree Molt M.D.   On: 01/04/2024 11:10  Increased interstitial markings.   EKG: personally reviewed my interpretation is normal sinus rhythm.   ASSESSMENT & PLAN:   Assessment & Plan by Problem: Active Problems:   * No active hospital problems. *   Manuel Welch. is a 53 y.o. person living with a history of HTN, HFrEF (45-50% on 07/26/22), HLD, osteoarthritis, and difficulty affording medications who presented with shortness of breath, headache,  and severe hypertension and admitted for heart failure exacerbation and hypertensive management.  #Shortness of breath #HFrEF (45-50% on 07/26/22) #Severe hypertension #Leukocytosis He has had shortness of breath over the last three days that is worse when laying down or with exertion. Also has pleuritic chest pain.He ran out of his medications about a week ago. His home regimen is Valsartan  80mg  and Amlodipine  10mg  per his report, but his fill history only shows Valsartan . He has crackles at his lung bases, but no JVD or lower extremity edema. His chest x-ray shows increased interstitial markings. BNP elevated at 120.9. These findings are most consistent with HFrEF exacerbation in the setting of increased afterload due to severe hypertension. The differential also includes pneumonia and ACS. He has had a non-productive cough and been afebrile, but does have a mild leukocytosis.  His troponins have been flat, making ACS highly unlikely. While not severely volume overloaded, he may benefit from gentle diuresis and will benefit from blood pressure control.  Plan: -one dose IV Lasix  40mg  -Irbesartan 75mg  once daily while inpatient due to formulary -Amlodipine  10mg  -Repeat Echo -strict Is and O's -Daily weights  -Daily BMP  #Headache Daily headaches in the morning when he wakes up, right before taking medications. Most likely in the setting of uncontrolled hypertension.  Plan: -HTN management as above -Tylenol  650mg  q6 PRN  Stable chronic problems  #HLD Continue home Atorvastatin  10mg   #Bilateral knee osteoarthritis On Celebrex  outpatient. Will hold in the setting of diuresis Plan: -Voltaren  gel while admitted, restart Celebrex  on discharge.  #Current smoker Uninterested in quitting at this time. Smokes 1/3 of a pack a day -Nicotine  patch and gum while admitted.  Best practice: Diet: Heart Healthy VTE: DOAC Code: Full  Disposition planning: Prior to Admission Living Arrangement:  Home, living with mom Anticipated Discharge Location: Home  Dispo: Admit patient to Observation with expected length of stay less than 2 midnights.  Signed: Napoleon Limes, MD Internal Medicine Resident  01/04/2024, 3:12 PM  Please contact IM Residency On-Call Pager at: [phone] or [phone].

## 2024-01-04 NOTE — ED Notes (Signed)
 Charge notified patient coming to inpatient floor.

## 2024-01-05 ENCOUNTER — Other Ambulatory Visit (HOSPITAL_COMMUNITY): Payer: Self-pay

## 2024-01-05 ENCOUNTER — Observation Stay (HOSPITAL_BASED_OUTPATIENT_CLINIC_OR_DEPARTMENT_OTHER): Payer: Self-pay

## 2024-01-05 ENCOUNTER — Ambulatory Visit: Payer: Self-pay | Admitting: Internal Medicine

## 2024-01-05 DIAGNOSIS — I160 Hypertensive urgency: Principal | ICD-10-CM

## 2024-01-05 DIAGNOSIS — F17210 Nicotine dependence, cigarettes, uncomplicated: Secondary | ICD-10-CM

## 2024-01-05 DIAGNOSIS — I5032 Chronic diastolic (congestive) heart failure: Principal | ICD-10-CM

## 2024-01-05 DIAGNOSIS — I429 Cardiomyopathy, unspecified: Secondary | ICD-10-CM

## 2024-01-05 DIAGNOSIS — I110 Hypertensive heart disease with heart failure: Secondary | ICD-10-CM

## 2024-01-05 LAB — BASIC METABOLIC PANEL WITH GFR
Anion gap: 10 (ref 5–15)
BUN: 17 mg/dL (ref 6–20)
CO2: 22 mmol/L (ref 22–32)
Calcium: 9.1 mg/dL (ref 8.9–10.3)
Chloride: 104 mmol/L (ref 98–111)
Creatinine, Ser: 0.92 mg/dL (ref 0.61–1.24)
GFR, Estimated: >60 mL/min (ref >60)
Glucose, Bld: 105 mg/dL — ABNORMAL HIGH (ref 70–99)
Potassium: 3.7 mmol/L (ref 3.5–5.1)
Sodium: 136 mmol/L (ref 135–145)

## 2024-01-05 LAB — ECHOCARDIOGRAM COMPLETE
AR max vel: 2.97 cm2
AV Area VTI: 2.8 cm2
AV Area mean vel: 2.86 cm2
AV Mean grad: 4 mmHg
AV Peak grad: 7.3 mmHg
Ao pk vel: 1.35 m/s
Area-P 1/2: 4.93 cm2
Calc EF: 41.7 %
Height: 73 in
S' Lateral: 5.03 cm
Single Plane A2C EF: 44.5 %
Single Plane A4C EF: 41 %
Weight: 4880 [oz_av]

## 2024-01-05 LAB — CBC
HCT: 42.9 % (ref 39.0–52.0)
Hemoglobin: 14.3 g/dL (ref 13.0–17.0)
MCH: 31.5 pg (ref 26.0–34.0)
MCHC: 33.3 g/dL (ref 30.0–36.0)
MCV: 94.5 fL (ref 80.0–100.0)
Platelets: 337 K/uL (ref 150–400)
RBC: 4.54 MIL/uL (ref 4.22–5.81)
RDW: 12.2 % (ref 11.5–15.5)
WBC: 7.3 K/uL (ref 4.0–10.5)
nRBC: 0 % (ref 0.0–0.2)

## 2024-01-05 MED ORDER — DICLOFENAC SODIUM 1 % EX GEL
2.0000 g | Freq: Four times a day (QID) | CUTANEOUS | 0 refills | Status: DC
Start: 2024-01-05 — End: 2024-04-10
  Filled 2024-01-05: qty 100, 13d supply, fill #0

## 2024-01-05 MED ORDER — CARVEDILOL 6.25 MG PO TABS
6.2500 mg | ORAL_TABLET | Freq: Two times a day (BID) | ORAL | 0 refills | Status: DC
  Filled 2024-01-05: qty 60, 30d supply, fill #0

## 2024-01-05 MED ORDER — CARVEDILOL 6.25 MG PO TABS
6.2500 mg | ORAL_TABLET | Freq: Two times a day (BID) | ORAL | Status: DC
  Administered 2024-01-05: 6.25 mg via ORAL
  Filled 2024-01-05: qty 1

## 2024-01-05 MED ORDER — NICOTINE POLACRILEX 2 MG MT GUM
2.0000 mg | CHEWING_GUM | OROMUCOSAL | 0 refills | Status: AC | PRN
  Filled 2024-01-05: qty 100, fill #0

## 2024-01-05 NOTE — Discharge Summary (Signed)
 Name: Manuel Welch. MRN: [id number] DOB: 02/24/1971 53 y.o. PCP: Manuel Barnie NOVAK, MD  Date of Admission: 01/04/2024 10:07 AM Date of Discharge: 01/05/2024 Attending Physician: Dr. Mliss Foot  Discharge Diagnosis: 1. Principal Problem:   Heart failure with improved ejection fraction (HFimpEF) (HCC) Active Problems:   Tobacco abuse   Severe hypertension    Discharge Medications: Allergies as of 01/05/2024       Reactions   Bee Venom         Medication List     STOP taking these medications    celecoxib  200 MG capsule Commonly known as: CeleBREX        TAKE these medications    atorvastatin  10 MG tablet Commonly known as: LIPITOR Take 1 tablet (10 mg total) by mouth daily.   carvedilol  6.25 MG tablet Commonly known as: COREG  Take 1 tablet (6.25 mg total) by mouth 2 (two) times daily with a meal.   diclofenac  Sodium 1 % Gel Commonly known as: VOLTAREN  Apply 2 g topically 4 (four) times daily.   nicotine  polacrilex 2 MG gum Commonly known as: NICORETTE  Take 1 each (2 mg total) by mouth as needed for smoking cessation.   valsartan  80 MG tablet Commonly known as: DIOVAN  Take 1 tablet (80 mg total) by mouth daily.        Disposition and follow-up:   [name] was discharged from [facility] in Good condition.  At the hospital follow up visit please address:  #Shortness of breath #HFrEF (45-50% on 07/26/22) #Severe hypertension Admitted for mild HFrEF exacerbation in the setting of missed medications. Discharged with Carvedilol  6.25mg  to add to GDMT. Continued Valsartan . Please evaluate BP and assess if another agent should be added, such as Spironolactone, which should be available at no cost to the patient at the [facility]. Please follow up on echocardiogram results.  #Headache Thought to be due to BP. Resolved with blood pressure medications. Assess recurrence of headaches.  #Bilateral knee  osteoarthritis Celebrex  discontinued in the setting of his heart failure. Discharged with Voltaren  gel. Consider alternative analgesic.  Labs / imaging needed at time of follow-up: None  Pending labs/ test needing follow-up: Official echocardiogram read.  Follow-up Appointments:  Follow-up Information     Manuel Barnie NOVAK, MD. Call in 1 week(s).   Specialty: Internal Medicine Why: Call to make an appointment in the next 1-2 weeks Contact information: [address] [place] [address] [phone]                  Hospital Course by problem list: Manuel Welch. is a 53 y.o. person living with a history of HTN, HFrEF (45-50% on 07/26/22), HLD, osteoarthritis, and difficulty affording medications who presented with shortness of breath, headache, and severe hypertension and admitted for heart failure exacerbation and hypertensive management, now being discharged with the following pertinent hospital course:  #Shortness of breath #HFrEF (45-50% on 07/26/22) #Severe hypertension #Leukocytosis Presented with shortness of breath for three days that is worse when laying down or with exertion. Also had pleuritic chest pain. He ran out of his medications about a week prior to admission. His home regimen is Valsartan  80mg  and Amlodipine  10mg  per his report, but his fill history only shows Valsartan  80mg . He had crackles at his lung bases, but no JVD or lower extremity edema. His chest x-ray showed increased interstitial markings. BNP elevated at 120.9. He had had a non-productive cough and been afebrile. His  troponins were flat, making ACS highly unlikely. Symptoms thought to be mild HFrEF exacerbation in the setting of missing afterload reducing medications. His BP on the morning of discharge was 132/91. Received one dose IV lasix  with improvement of his symptoms without any crackles on lung exam the day of discharge. Discharged with Valsartan  and new medication of Coreg  6.25 BID  for GDMT as this will be covered on the free medications list at the patients pharmacy. His leukocytosis also improved without antibiotics. Updated echocardiogram after diuresis obtained but official reading not returned before discharge.    #Headache  Has had daily headaches in the morning when he wakes up, right before taking medications. Most likely in the setting of uncontrolled hypertension. Headaches improved with BP control.    Stable chronic problems   #HLD Continue home Atorvastatin  10mg    #Bilateral knee osteoarthritis Celebrex  held in the setting of diuresis, will discontinue in the setting of heart failure. Prescribed Diclofenac  gel on discharge   #Current smoker Uninterested in quitting at this time. Smokes 1/3 of a pack a day -Nicotine  patch and gum while admitted.     Subjective This morning, did not have a headache or shortness of breath.  He notes that he is ready to go home today.  He was agreeable to stay to get his echocardiogram done.  Discharge Exam:   BP (!) 151/122 (BP Location: Right Arm)   Pulse 96   Temp 98 F (36.7 C) (Oral)   Resp 18   Ht 6' 1 (1.854 m)   Wt (!) 138.3 kg   SpO2 94%   BMI 40.24 kg/m  Discharge exam:  Physical Exam Constitutional:      General: He is not in acute distress.    Appearance: He is not ill-appearing.  Cardiovascular:     Rate and Rhythm: Normal rate and regular rhythm.  Pulmonary:     Effort: Pulmonary effort is normal. No respiratory distress.     Breath sounds: No stridor. No wheezing, rhonchi or rales.  Musculoskeletal:     Right lower leg: No edema.     Left lower leg: No edema.  Neurological:     Mental Status: He is alert.      Pertinent Labs, Studies, and Procedures:     Latest Ref Rng & Units 01/05/2024    3:16 AM 01/04/2024   10:51 AM 08/09/2023    3:54 PM  CBC  WBC 4.0 - 10.5 K/uL 7.3  11.7  7.4   Hemoglobin 13.0 - 17.0 g/dL 85.6  86.1  84.9   Hematocrit 39.0 - 52.0 % 42.9  43.3  45.3    Platelets 150 - 400 K/uL 337  356  489        Latest Ref Rng & Units 01/05/2024    3:16 AM 01/04/2024   10:51 AM 08/09/2023    3:54 PM  CMP  Glucose 70 - 99 mg/dL 894  78  92   BUN 6 - 20 mg/dL 17  12  13    Creatinine 0.61 - 1.24 mg/dL 9.07  8.97  9.12   Sodium 135 - 145 mmol/L 136  139  140   Potassium 3.5 - 5.1 mmol/L 3.7  4.0  4.4   Chloride 98 - 111 mmol/L 104  106  105   CO2 22 - 32 mmol/L 22  20  19    Calcium  8.9 - 10.3 mg/dL 9.1  9.4  9.6   Total Protein 6.0 - 8.5 g/dL   6.9  Total Bilirubin 0.0 - 1.2 mg/dL   0.2   Alkaline Phos 44 - 121 IU/L   101   AST 0 - 40 IU/L   12   ALT 0 - 44 IU/L   14     DG Chest 1 View Result Date: 01/04/2024 CLINICAL DATA:  Shortness of breath. EXAM: CHEST  1 VIEW COMPARISON:  04/10/2022. FINDINGS: Note is made of elevated right hemidiaphragm. Bilateral lung fields are clear. Bilateral costophrenic angles are clear. Normal cardio-mediastinal silhouette. No acute osseous abnormalities. The soft tissues are within normal limits. IMPRESSION: No active disease. Electronically Signed   By: Ree Molt M.D.   On: 01/04/2024 11:10     Discharge Instructions: You were hospitalized for fluid on your lungs and high blood pressure. Thank you for allowing us  to be part of your care.   Please follow up with Dr. Vicci, your primary doctor, in 1-2 weeks   Please note these changes made to your medications:   *Please START taking:  Coreg  6.25mg  twice a day Valsartan  80mg  once a day Atorvastatin  10mg  once a day Diclofenac  gel as needed for knee pain  *Please STOP taking:  Celebrex  200mg   Please make sure to call your primary care doctor for a follow-up appointment.  Signed: Napoleon Limes, MD 01/05/2024, 10:22 AM

## 2024-01-05 NOTE — Plan of Care (Signed)

## 2024-01-05 NOTE — Discharge Instructions (Addendum)
 You were hospitalized for fluid on your lungs and high blood pressure. Thank you for allowing us  to be part of your care.   Please follow up with Dr. Vicci, your primary doctor, in 1-2 weeks   Please note these changes made to your medications:   *Please START taking:  Coreg  6.25mg  twice a day Valsartan  80mg  once a day Atorvastatin  10mg  once a day Diclofenac  gel as needed for knee pain  *Please STOP taking:  Celebrex  200mg   Please make sure to call your primary care doctor for a follow-up appointment.

## 2024-01-07 ENCOUNTER — Telehealth: Payer: Self-pay

## 2024-01-07 NOTE — Transitions of Care (Post Inpatient/ED Visit) (Signed)
   01/07/2024  Name: Harvel Meskill. MRN: [id number] DOB: [birth year]  Today's TOC FU Call Status: Today's TOC FU Call Status:: Unsuccessful Call (1st Attempt) Unsuccessful Call (1st Attempt) Date: 01/07/24  Attempted to reach the patient regarding the most recent Inpatient/ED visit.  Follow Up Plan: Additional outreach attempts will be made to reach the patient to complete the Transitions of Care (Post Inpatient/ED visit) call.   Signature  Slater Diesel, RN

## 2024-01-08 ENCOUNTER — Telehealth: Payer: Self-pay

## 2024-01-08 NOTE — Transitions of Care (Post Inpatient/ED Visit) (Signed)
   01/08/2024  Name: Manuel Welch. MRN: [id number] DOB: 10-29-1970  Today's TOC FU Call Status: Today's TOC FU Call Status:: Unsuccessful Call (2nd Attempt) Unsuccessful Call (1st Attempt) Date: 01/07/24 Unsuccessful Call (2nd Attempt) Date: 01/08/24  Attempted to reach the patient regarding the most recent Inpatient/ED visit.  Follow Up Plan: Additional outreach attempts will be made to reach the patient to complete the Transitions of Care (Post Inpatient/ED visit) call.   I also called [phone], which is listed for the patient and for his mother and the number is not in service.   Signature  Slater Diesel, RN

## 2024-01-09 ENCOUNTER — Telehealth: Payer: Self-pay

## 2024-01-09 NOTE — Transitions of Care (Post Inpatient/ED Visit) (Signed)
   01/09/2024  Name: Manuel Welch. MRN: [id number] DOB: 1970-07-25  Today's TOC FU Call Status: Today's TOC FU Call Status:: Unsuccessful Call (3rd Attempt) Unsuccessful Call (1st Attempt) Date: 01/07/24 Unsuccessful Call (2nd Attempt) Date: 01/08/24 Unsuccessful Call (3rd Attempt) Date: 01/09/24  Attempted to reach the patient regarding the most recent Inpatient/ED visit.  Follow Up Plan: No further outreach attempts will be made at this time. We have been unable to contact the patient.  I also called the patient's other number which is  listed as his mother's number: [phone] and the recording stated that the number is not in service.   Letter sent to patient requesting he call [facility] to schedule a follow up appointment as we have not been able to reach him.     Signature  Slater Diesel, RN

## 2024-01-09 NOTE — Telephone Encounter (Signed)
 Copied from CRM [id number]. Topic: Clinical - Medical Advice >> Jan 09, 2024  1:11 PM Carlatta H wrote:  Reason for CRM: Nurse Slater please give the patient a call back regarding transitions of care

## 2024-01-10 NOTE — Telephone Encounter (Signed)
 Call returned to patient [phone]  and someone answered then hung up. I called again and had to leave a message requesting a call back.  If the patient calls back, please schedule a hospital follow up appointment for him

## 2024-01-14 ENCOUNTER — Other Ambulatory Visit: Payer: Self-pay

## 2024-01-15 ENCOUNTER — Other Ambulatory Visit: Payer: Self-pay

## 2024-01-16 ENCOUNTER — Other Ambulatory Visit: Payer: Self-pay

## 2024-01-24 ENCOUNTER — Other Ambulatory Visit: Payer: Self-pay

## 2024-03-26 ENCOUNTER — Other Ambulatory Visit: Payer: Self-pay

## 2024-04-04 ENCOUNTER — Other Ambulatory Visit: Payer: Self-pay

## 2024-04-10 ENCOUNTER — Ambulatory Visit: Attending: Internal Medicine | Admitting: Internal Medicine

## 2024-04-10 ENCOUNTER — Encounter: Payer: Self-pay | Admitting: Internal Medicine

## 2024-04-10 ENCOUNTER — Other Ambulatory Visit: Payer: Self-pay

## 2024-04-10 VITALS — BP 155/102 | HR 96 | Temp 98.3°F | Ht 73.0 in | Wt 292.0 lb

## 2024-04-10 DIAGNOSIS — Z2821 Immunization not carried out because of patient refusal: Secondary | ICD-10-CM

## 2024-04-10 DIAGNOSIS — F17200 Nicotine dependence, unspecified, uncomplicated: Secondary | ICD-10-CM

## 2024-04-10 DIAGNOSIS — M170 Bilateral primary osteoarthritis of knee: Secondary | ICD-10-CM

## 2024-04-10 DIAGNOSIS — I429 Cardiomyopathy, unspecified: Secondary | ICD-10-CM

## 2024-04-10 DIAGNOSIS — Z1211 Encounter for screening for malignant neoplasm of colon: Secondary | ICD-10-CM

## 2024-04-10 DIAGNOSIS — I10 Essential (primary) hypertension: Secondary | ICD-10-CM | POA: Diagnosis not present

## 2024-04-10 MED ORDER — CARVEDILOL 6.25 MG PO TABS
6.2500 mg | ORAL_TABLET | Freq: Two times a day (BID) | ORAL | 2 refills | Status: AC
  Filled 2024-04-10: qty 180, 90d supply, fill #0

## 2024-04-10 MED ORDER — VALSARTAN 80 MG PO TABS
80.0000 mg | ORAL_TABLET | Freq: Every day | ORAL | 1 refills | Status: AC
  Filled 2024-04-10: qty 90, 90d supply, fill #0

## 2024-04-10 MED ORDER — DULOXETINE HCL 20 MG PO CPEP
20.0000 mg | ORAL_CAPSULE | Freq: Every day | ORAL | 3 refills | Status: AC
  Filled 2024-04-10: qty 30, 30d supply, fill #0
  Filled 2024-05-05: qty 30, 30d supply, fill #1

## 2024-04-10 MED ORDER — ATORVASTATIN CALCIUM 10 MG PO TABS
10.0000 mg | ORAL_TABLET | Freq: Every day | ORAL | 1 refills | Status: AC
  Filled 2024-04-10: qty 90, 90d supply, fill #0
  Filled 2024-07-15: qty 90, 90d supply, fill #1

## 2024-04-10 MED ORDER — DICLOFENAC SODIUM 1 % EX GEL
2.0000 g | Freq: Four times a day (QID) | CUTANEOUS | 2 refills | Status: AC
  Filled 2024-04-10: qty 100, 13d supply, fill #0

## 2024-04-10 NOTE — Progress Notes (Signed)
 Patient ID: Margaret Staggs., male    DOB: [birth year]  MRN: [id number]  CC: Hospitalization Follow-up (Hospitalization f/u. /Bilateral knee pain /No to all vax)   Subjective: Bishop Vanderwerf is a 53 y.o. male who presents for chronic ds management. His concerns today include:  Patient with history of HTN, obesity,CM with a EF of 35-40% July 2025, tob dep, cocaine use, OA knee   Discussed the use of AI scribe software for clinical note transcription with the patient, who gave verbal consent to proceed.  History of Present Illness Nathanyl Tamarick Kovalcik. is a 53 year old male with hypertension, congestive heart failure, and hyperlipidemia who presents for follow-up after a recent hospitalization.  He was hospitalized in July for congestive heart failure after missing his medications. Since discharge, he has been taking his medications regularly, although he ran out of carvedilol  and valsartan  about a week ago due to not realizing he had refills available. He is also on atorvastatin  for cholesterol management but ran out of that 1 wk ago as well. He has empty bottles with him. Of note, the bottles do indicate that he has 1 RF left. No CP/SOB/LE edema.  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 The pain causes difficulty walking and limping, and is worse in the morning. He reports swelling in his knees but not in his legs. Request RF on Diclofenac  gel and pain medication.  He smokes one pack of cigarettes every three days and does not plan to quit.   HM: He is due for colon cancer screening and prefers a colonoscopy over a stool test. Declines flu, PCV and Shingles vaccines.    Patient Active Problem List   Diagnosis Date Noted   Hypertensive urgency 01/05/2024   Severe hypertension 01/04/2024   Heart failure with improved ejection fraction (HFimpEF) (HCC) 01/04/2024   Mixed hyperlipidemia 06/22/2022   Primary  osteoarthritis of right knee 03/20/2022   Encounter to establish care 03/11/2020   Essential hypertension 03/11/2020   Tobacco dependence 03/11/2020   Cardiomyopathy (HCC)    Cocaine use    Obesity, Class III, BMI 40-49.9 (morbid obesity) (HCC)    Multifocal pneumonia 12/09/2019   Atypical pneumonia 12/08/2019   Chest pain 12/08/2019   Accelerated hypertension    Elevated troponin    Tobacco abuse      Current Outpatient Medications on File Prior to Visit  Medication Sig Dispense Refill   nicotine  polacrilex (NICORETTE ) 2 MG gum Take 1 each (2 mg total) by mouth as needed for smoking cessation. 100 tablet 0   No current facility-administered medications on file prior to visit.    Allergies  Allergen Reactions   Bee Venom     Social History   Socioeconomic History   Marital status: Single    Spouse name: Not on file   Number of children: Not on file   Years of education: Not on file   Highest education level: Not on file  Occupational History   Not on file  Tobacco Use   Smoking status: Every Day    Current packs/day: 0.25    Average packs/day: 0.3 packs/day for 29.0 years (7.3 ttl pk-yrs)    Types: Cigarettes   Smokeless tobacco: Never  Vaping Use   Vaping status: Never Used  Substance and Sexual Activity   Alcohol use: Yes   Drug use: No   Sexual activity: Yes  Other  Topics Concern   Not on file  Social History Narrative   Lives at home with mother.   Social Drivers of Corporate investment banker Strain: Not on file  Food Insecurity: No Food Insecurity (01/05/2024)   Hunger Vital Sign    Worried About Running Out of Food in the Last Year: Never true    Ran Out of Food in the Last Year: Never true  Transportation Needs: No Transportation Needs (01/05/2024)   PRAPARE - Administrator, Civil Service (Medical): No    Lack of Transportation (Non-Medical): No  Physical Activity: Not on file  Stress: Not on file  Social Connections: Not on file   Intimate Partner Violence: Patient Declined (01/05/2024)   Humiliation, Afraid, Rape, and Kick questionnaire    Fear of Current or Ex-Partner: Patient declined    Emotionally Abused: Patient declined    Physically Abused: Patient declined    Sexually Abused: Patient declined    Family History  Problem Relation Age of Onset   Hypertension Mother    Hypertension Father    Hypertension Brother     Past Surgical History:  Procedure Laterality Date   NO PAST SURGERIES      ROS: Review of Systems Negative except as stated above  PHYSICAL EXAM: BP (!) 155/102 (BP Location: Left Arm, Patient Position: Sitting)   Pulse 96   Temp 98.3 F (36.8 C) (Oral)   Ht 6' 1 (1.854 m)   Wt 292 lb (132.5 kg)   SpO2 98%   BMI 38.52 kg/m   Physical Exam  General appearance - alert, well appearing, and in no distress Mental status - normal mood, behavior, speech, dress, motor activity, and thought processes Chest - clear to auscultation, no wheezes, rales or rhonchi, symmetric air entry Heart - normal rate, regular rhythm, normal S1, S2, no murmurs, rubs, clicks or gallops Musculoskeletal - Mild enlargement of knee jts LT>RT. Moderate crepitus on ROM LT and mild on RT Extremities - peripheral pulses normal, no pedal edema, no clubbing or cyanosis     Latest Ref Rng & Units 01/05/2024    3:16 AM 01/04/2024   10:51 AM 08/09/2023    3:54 PM  CMP  Glucose 70 - 99 mg/dL 894  78  92   BUN 6 - 20 mg/dL 17  12  13    Creatinine 0.61 - 1.24 mg/dL 9.07  8.97  9.12   Sodium 135 - 145 mmol/L 136  139  140   Potassium 3.5 - 5.1 mmol/L 3.7  4.0  4.4   Chloride 98 - 111 mmol/L 104  106  105   CO2 22 - 32 mmol/L 22  20  19    Calcium  8.9 - 10.3 mg/dL 9.1  9.4  9.6   Total Protein 6.0 - 8.5 g/dL   6.9   Total Bilirubin 0.0 - 1.2 mg/dL   0.2   Alkaline Phos 44 - 121 IU/L   101   AST 0 - 40 IU/L   12   ALT 0 - 44 IU/L   14    Lipid Panel     Component Value Date/Time   CHOL 148 08/09/2023 1554    TRIG 201 (H) 08/09/2023 1554   HDL 42 08/09/2023 1554   CHOLHDL 3.5 08/09/2023 1554   CHOLHDL 5.2 12/09/2019 0527   VLDL 18 12/09/2019 0527   LDLCALC 72 08/09/2023 1554    CBC    Component Value Date/Time   WBC 7.3 01/05/2024 0316  RBC 4.54 01/05/2024 0316   HGB 14.3 01/05/2024 0316   HGB 15.0 08/09/2023 1554   HCT 42.9 01/05/2024 0316   HCT 45.3 08/09/2023 1554   PLT 337 01/05/2024 0316   PLT 489 (H) 08/09/2023 1554   MCV 94.5 01/05/2024 0316   MCV 94 08/09/2023 1554   MCH 31.5 01/05/2024 0316   MCHC 33.3 01/05/2024 0316   RDW 12.2 01/05/2024 0316   RDW 11.6 08/09/2023 1554   LYMPHSABS 2.2 01/04/2024 1051   MONOABS 1.0 01/04/2024 1051   EOSABS 0.2 01/04/2024 1051   BASOSABS 0.1 01/04/2024 1051    ASSESSMENT AND PLAN: 1. Cardiomyopathy, unspecified type (HCC) (Primary) Compensated. Discussed importance of keeping up with Rfs on his meds and not allowing himself to run out. RF sent on Coreg  and Diovan .  2. Essential hypertension Not at goal. Out of Coreg  and Diovan  x 1 wk. RF sent for 90 day supply with refills  - valsartan  (DIOVAN ) 80 MG tablet; Take 1 tablet (80 mg total) by mouth daily.  Dispense: 90 tablet; Refill: 1 - atorvastatin  (LIPITOR) 10 MG tablet; Take 1 tablet (10 mg total) by mouth daily.  Dispense: 90 tablet; Refill: 1 - carvedilol  (COREG ) 6.25 MG tablet; Take 1 tablet (6.25 mg total) by mouth 2 (two) times daily with a meal.  Dispense: 180 tablet; Refill: 2  3. Primary osteoarthritis of both knees Discuss importance of weight loss RF Voltaren  Gel Discuss adding low dose Cymbalta and pt is agreeable to trying the med Refer to ortho - diclofenac  Sodium (VOLTAREN ) 1 % GEL; Apply 2 g topically 4 (four) times daily.  Dispense: 100 g; Refill: 2 - DULoxetine (CYMBALTA) 20 MG capsule; Take 1 capsule (20 mg total) by mouth at bedtime.  Dispense: 30 capsule; Refill: 3 - AMB referral to orthopedics  4. Tobacco dependence Discussed risks including increased  risk for heart attack, stroke, and various cancers. Advised to quit but pt declined trail of quitting  5. Influenza vaccination declined Recommended. Pt declined  6. Screening for colon cancer Discussed methods of screening for average risk pts. He prefer c-scope - Ambulatory referral to Gastroenterology    Patient was given the opportunity to ask questions.  Patient verbalized understanding of the plan and was able to repeat key elements of the plan.   This documentation was completed using Paediatric nurse.  Any transcriptional errors are unintentional.  Orders Placed This Encounter  Procedures   AMB referral to orthopedics   Ambulatory referral to Gastroenterology     Requested Prescriptions   Signed Prescriptions Disp Refills   valsartan  (DIOVAN ) 80 MG tablet 90 tablet 1    Sig: Take 1 tablet (80 mg total) by mouth daily.   atorvastatin  (LIPITOR) 10 MG tablet 90 tablet 1    Sig: Take 1 tablet (10 mg total) by mouth daily.   carvedilol  (COREG ) 6.25 MG tablet 180 tablet 2    Sig: Take 1 tablet (6.25 mg total) by mouth 2 (two) times daily with a meal.   diclofenac  Sodium (VOLTAREN ) 1 % GEL 100 g 2    Sig: Apply 2 g topically 4 (four) times daily.   DULoxetine (CYMBALTA) 20 MG capsule 30 capsule 3    Sig: Take 1 capsule (20 mg total) by mouth at bedtime.    Return in about 4 months (around 08/11/2024) for BP check Luke in 4 wks.  Barnie Louder, MD, FACP

## 2024-04-10 NOTE — Patient Instructions (Signed)
  VISIT SUMMARY: You came in today for a follow-up visit after your recent hospitalization for congestive heart failure. We discussed your current medications, knee pain, smoking habits, and the need for colorectal cancer screening.  YOUR PLAN: -CONGESTIVE HEART FAILURE AND ESSENTIAL HYPERTENSION: Congestive heart failure is a condition where the heart doesn't pump blood as well as it should, and essential hypertension is high blood pressure with no identifiable cause. Your blood pressure was elevated today because you missed some doses of your medications. We have sent updated prescriptions for carvedilol  and valsartan , and it's important to pick them up from the pharmacy today. Please remember to check your medication refills regularly and take your medications as prescribed to prevent any worsening of your condition.  -HYPERLIPIDEMIA: Hyperlipidemia means having high levels of fats (like cholesterol) in your blood, which can increase your risk of heart disease. We will continue managing this with atorvastatin , and an updated prescription has been sent.  -BILATERAL PRIMARY OSTEOARTHRITIS OF KNEE: Osteoarthritis is a type of arthritis that occurs when the protective cartilage that cushions the ends of your bones wears down over time. Your knee pain is likely due to this condition. We have prescribed diclofenac  cream for you to apply to your knees and started you on Cymbalta, which you should take at night. We are also referring you to orthopedics for further evaluation and possible knee injections.  -NICOTINE  DEPENDENCE: Nicotine  dependence means being addicted to the nicotine  in tobacco. Continuing to smoke increases your risk for heart attack, stroke, and various cancers. We discussed the importance of quitting smoking and the health risks associated with it.  -COLORECTAL CANCER SCREENING: Colorectal cancer screening is important for detecting early signs of cancer in the colon or rectum. Since you  are due for screening, we are referring you for a colonoscopy with gastroenterology.  INSTRUCTIONS: Please pick up your medications from the pharmacy today and take them as prescribed. Follow up with orthopedics for your knee pain evaluation and potential injections. We have also referred you for a colonoscopy, so please schedule that appointment. If you have any questions or concerns, feel free to contact our office.                      Contains text generated by Abridge.                                 Contains text generated by Abridge.

## 2024-05-07 ENCOUNTER — Telehealth: Payer: Self-pay | Admitting: Internal Medicine

## 2024-05-07 NOTE — Telephone Encounter (Signed)
 Confirmed appt for 11/20.

## 2024-05-08 ENCOUNTER — Ambulatory Visit: Admitting: Pharmacist

## 2024-05-13 ENCOUNTER — Other Ambulatory Visit: Payer: Self-pay

## 2024-05-14 ENCOUNTER — Other Ambulatory Visit: Payer: Self-pay

## 2024-05-15 ENCOUNTER — Other Ambulatory Visit: Payer: Self-pay

## 2024-05-15 ENCOUNTER — Emergency Department (HOSPITAL_COMMUNITY)

## 2024-05-15 ENCOUNTER — Emergency Department (HOSPITAL_COMMUNITY)
Admission: EM | Admit: 2024-05-15 | Discharge: 2024-05-15 | Disposition: A | Discharge status: Home / Self Care | Attending: Emergency Medicine | Admitting: Emergency Medicine

## 2024-05-15 ENCOUNTER — Encounter (HOSPITAL_COMMUNITY): Payer: Self-pay

## 2024-05-15 DIAGNOSIS — M545 Low back pain, unspecified: Secondary | ICD-10-CM | POA: Insufficient documentation

## 2024-05-15 DIAGNOSIS — M25552 Pain in left hip: Secondary | ICD-10-CM | POA: Diagnosis present

## 2024-05-15 DIAGNOSIS — M7918 Myalgia, other site: Secondary | ICD-10-CM

## 2024-05-15 MED ORDER — OXYCODONE-ACETAMINOPHEN 5-325 MG PO TABS
1.0000 | ORAL_TABLET | Freq: Once | ORAL | Status: AC
  Administered 2024-05-15: 1 via ORAL
  Filled 2024-05-15: qty 1

## 2024-05-15 MED ORDER — METHOCARBAMOL 500 MG PO TABS: 500.0000 mg | ORAL_TABLET | Freq: Two times a day (BID) | ORAL | 0 refills | Status: AC | PRN

## 2024-05-15 NOTE — ED Triage Notes (Signed)
 C/O left hip pain that stated 3 days ago. Pt states he thinks he pulled a muscle. Pt able to ambulate.

## 2024-05-15 NOTE — ED Provider Notes (Signed)
 [place] EMERGENCY DEPARTMENT AT [facility] Provider Note   CSN: [id number] Arrival date & time: 05/15/24  1350     Patient presents with: Hip Pain   Manuel Welch. is a 53 y.o. male.  He is here with a complaint of pain in his left buttock and hip has been going on for 3 days.  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  Worse with bending and twisting.  No numbness or weakness.  No abdominal pain.  Has tried ibuprofen without improvement.   The history is provided by the patient.  Hip Pain This is a new problem. The current episode started more than 2 days ago. The problem occurs constantly. The problem has not changed since onset.Pertinent negatives include no chest pain, no abdominal pain, no headaches and no shortness of breath. The symptoms are aggravated by bending, twisting and walking. Nothing relieves the symptoms. Treatments tried: ibuprofen. The treatment provided no relief.       Prior to Admission medications   Medication Sig Start Date End Date Taking? Authorizing Provider  atorvastatin  (LIPITOR) 10 MG tablet Take 1 tablet (10 mg total) by mouth daily. 04/10/24   Vicci Barnie NOVAK, MD  carvedilol  (COREG ) 6.25 MG tablet Take 1 tablet (6.25 mg total) by mouth 2 (two) times daily with a meal. 04/10/24   Vicci Barnie NOVAK, MD  diclofenac  Sodium (VOLTAREN ) 1 % GEL Apply 2 g topically 4 (four) times daily. 04/10/24   Vicci Barnie NOVAK, MD  DULoxetine  (CYMBALTA ) 20 MG capsule Take 1 capsule (20 mg total) by mouth at bedtime. 04/10/24   Vicci Barnie NOVAK, MD  nicotine  polacrilex (NICORETTE ) 2 MG gum Take 1 each (2 mg total) by mouth as needed for smoking cessation. 01/05/24   Smucker, Melvenia, MD  valsartan  (DIOVAN ) 80 MG tablet Take 1 tablet (80 mg total) by mouth daily. 04/10/24   Vicci Barnie NOVAK, MD    Allergies: Bee venom    Review of Systems  Respiratory:  Negative for shortness of breath.    Cardiovascular:  Negative for chest pain.  Gastrointestinal:  Negative for abdominal pain.  Neurological:  Negative for headaches.    Updated Vital Signs BP (!) 153/97 (BP Location: Right Arm)   Pulse 98   Temp 99 F (37.2 C)   Resp 18   Ht 6' 1 (1.854 m)   Wt 127 kg   SpO2 99%   BMI 36.94 kg/m   Physical Exam Vitals and nursing note reviewed.  Constitutional:      Appearance: He is well-developed.  HENT:     Head: Normocephalic and atraumatic.  Eyes:     Conjunctiva/sclera: Conjunctivae normal.  Cardiovascular:     Rate and Rhythm: Normal rate and regular rhythm.     Heart sounds: No murmur heard. Pulmonary:     Effort: Pulmonary effort is normal. No respiratory distress.     Breath sounds: Normal breath sounds.  Abdominal:     Palpations: Abdomen is soft.     Tenderness: There is no abdominal tenderness. There is no guarding or rebound.  Musculoskeletal:        General: Tenderness present. No deformity.     Cervical back: Neck supple.     Comments: He has some tenderness in the left gluteal area.  He has normal distal strength and sensation.  No midline spine tenderness.  Skin:    General: Skin is  warm and dry.  Neurological:     General: No focal deficit present.     Mental Status: He is alert.     GCS: GCS eye subscore is 4. GCS verbal subscore is 5. GCS motor subscore is 6.     Sensory: No sensory deficit.     Motor: No weakness.     (all labs ordered are listed, but only abnormal results are displayed) Labs Reviewed - No data to display  EKG: None  Radiology: DG Hip Unilat With Pelvis 2-3 Views Left Result Date: 05/15/2024 CLINICAL DATA:  Left hip pain beginning 3 days ago. Patient suspects that he pulled a muscle. No reported fall. EXAM: DG HIP (WITH OR WITHOUT PELVIS) 2-3V LEFT COMPARISON:  None Available. FINDINGS: No fracture or bone lesion. Mild concentric left hip joint space narrowing. Small marginal osteophyte from the superior base of the  left femoral head. Right hip joint, SI joints and pubic symphysis are normally spaced and aligned with no significant degenerative/arthropathic changes. Soft tissues are unremarkable. IMPRESSION: 1. No fracture or acute finding. 2. Mild degenerative/arthropathic changes of the left hip. Electronically Signed   By: Alm Parkins M.D.   On: 05/15/2024 14:48     Procedures   Medications Ordered in the ED - No data to display                                  Medical Decision Making Amount and/or Complexity of Data Reviewed Radiology: ordered.  Risk Prescription drug management.   This patient complains of left hip and buttock pain; this involves an extensive number of treatment Options and is a complaint that carries with it a high risk of complications and morbidity. The differential includes muscle strain, sciatica, fracture, dislocation I ordered medication oral pain medicine and reviewed PMP when indicated. I ordered imaging studies which included left hip x-ray and I independently    visualized and interpreted imaging which showed no acute findings Previous records obtained and reviewed in epic including PCP and recent ED visit Social determinants considered, tobacco use Critical Interventions: None  After the interventions stated above, I reevaluated the patient and found patient to be neurovascularly intact in no distress Admission and further testing considered, no indications for admission.  Will put on muscle relaxant and recommended close follow-up with PCP.  Return instructions discussed.      Final diagnoses:  Acute pain of left hip  Pain in left buttock    ED Discharge Orders     None          Towana Ozell BROCKS, MD 05/16/24 [phone]

## 2024-05-21 ENCOUNTER — Telehealth: Payer: Self-pay | Admitting: Internal Medicine

## 2024-05-21 NOTE — Telephone Encounter (Signed)
 Called patient to reschedule appointment for 12/4 LVM to call back

## 2024-05-22 ENCOUNTER — Ambulatory Visit: Admitting: Pharmacist

## 2024-07-24 ENCOUNTER — Other Ambulatory Visit: Payer: Self-pay

## 2024-08-14 ENCOUNTER — Ambulatory Visit: Admitting: Internal Medicine
